# Patient Record
Sex: Female | Born: 1935 | Race: White | Hispanic: No | Marital: Married | Smoking: Never smoker
Health system: Southern US, Community
[De-identification: ages and names within clinical notes are randomized; demographics above are authoritative.]

## PROBLEM LIST (undated history)

## (undated) DIAGNOSIS — E119 Type 2 diabetes mellitus without complications: Secondary | ICD-10-CM

## (undated) DIAGNOSIS — I10 Essential (primary) hypertension: Secondary | ICD-10-CM

## (undated) DIAGNOSIS — I639 Cerebral infarction, unspecified: Secondary | ICD-10-CM

## (undated) HISTORY — PX: APPENDECTOMY: SHX54

---

## 2010-05-09 ENCOUNTER — Other Ambulatory Visit (HOSPITAL_BASED_OUTPATIENT_CLINIC_OR_DEPARTMENT_OTHER): Payer: Self-pay | Admitting: Internal Medicine

## 2010-05-09 DIAGNOSIS — Z139 Encounter for screening, unspecified: Secondary | ICD-10-CM

## 2010-05-10 ENCOUNTER — Ambulatory Visit (HOSPITAL_BASED_OUTPATIENT_CLINIC_OR_DEPARTMENT_OTHER)
Admission: RE | Admit: 2010-05-10 | Discharge: 2010-05-10 | Disposition: A | Discharge status: Home / Self Care | Payer: Medicare Other | Source: Ambulatory Visit | Attending: Internal Medicine | Admitting: Internal Medicine

## 2010-05-10 DIAGNOSIS — Z1231 Encounter for screening mammogram for malignant neoplasm of breast: Secondary | ICD-10-CM | POA: Insufficient documentation

## 2010-05-10 DIAGNOSIS — Z139 Encounter for screening, unspecified: Secondary | ICD-10-CM

## 2011-04-04 ENCOUNTER — Other Ambulatory Visit (HOSPITAL_BASED_OUTPATIENT_CLINIC_OR_DEPARTMENT_OTHER): Payer: Self-pay | Admitting: *Deleted

## 2011-04-04 ENCOUNTER — Other Ambulatory Visit (HOSPITAL_BASED_OUTPATIENT_CLINIC_OR_DEPARTMENT_OTHER): Payer: Self-pay | Admitting: Internal Medicine

## 2011-04-04 DIAGNOSIS — Z1231 Encounter for screening mammogram for malignant neoplasm of breast: Secondary | ICD-10-CM

## 2011-05-16 ENCOUNTER — Other Ambulatory Visit (HOSPITAL_BASED_OUTPATIENT_CLINIC_OR_DEPARTMENT_OTHER): Payer: Self-pay | Admitting: Internal Medicine

## 2011-05-16 ENCOUNTER — Ambulatory Visit (HOSPITAL_BASED_OUTPATIENT_CLINIC_OR_DEPARTMENT_OTHER)
Admission: RE | Admit: 2011-05-16 | Discharge: 2011-05-16 | Disposition: A | Discharge status: Home / Self Care | Payer: Medicare Other | Source: Ambulatory Visit | Attending: Internal Medicine | Admitting: Internal Medicine

## 2011-05-16 DIAGNOSIS — Z1231 Encounter for screening mammogram for malignant neoplasm of breast: Secondary | ICD-10-CM

## 2012-04-08 ENCOUNTER — Other Ambulatory Visit (HOSPITAL_BASED_OUTPATIENT_CLINIC_OR_DEPARTMENT_OTHER): Payer: Self-pay | Admitting: Family Medicine

## 2012-04-08 DIAGNOSIS — Z1231 Encounter for screening mammogram for malignant neoplasm of breast: Secondary | ICD-10-CM

## 2012-05-27 ENCOUNTER — Inpatient Hospital Stay (HOSPITAL_BASED_OUTPATIENT_CLINIC_OR_DEPARTMENT_OTHER): Admission: RE | Admit: 2012-05-27 | Payer: Medicare Other | Source: Ambulatory Visit

## 2012-05-28 ENCOUNTER — Ambulatory Visit (HOSPITAL_BASED_OUTPATIENT_CLINIC_OR_DEPARTMENT_OTHER): Payer: Medicare Other

## 2013-12-21 ENCOUNTER — Other Ambulatory Visit (HOSPITAL_BASED_OUTPATIENT_CLINIC_OR_DEPARTMENT_OTHER): Payer: Self-pay | Admitting: Family Medicine

## 2013-12-21 DIAGNOSIS — Z1231 Encounter for screening mammogram for malignant neoplasm of breast: Secondary | ICD-10-CM

## 2013-12-22 ENCOUNTER — Ambulatory Visit (HOSPITAL_BASED_OUTPATIENT_CLINIC_OR_DEPARTMENT_OTHER)
Admission: RE | Admit: 2013-12-22 | Discharge: 2013-12-22 | Disposition: A | Discharge status: Home / Self Care | Payer: Medicare Other | Source: Ambulatory Visit | Attending: Family Medicine | Admitting: Family Medicine

## 2013-12-22 DIAGNOSIS — Z1231 Encounter for screening mammogram for malignant neoplasm of breast: Secondary | ICD-10-CM | POA: Insufficient documentation

## 2015-01-06 ENCOUNTER — Other Ambulatory Visit (HOSPITAL_BASED_OUTPATIENT_CLINIC_OR_DEPARTMENT_OTHER): Payer: Self-pay | Admitting: Family Medicine

## 2015-01-06 DIAGNOSIS — Z1231 Encounter for screening mammogram for malignant neoplasm of breast: Secondary | ICD-10-CM

## 2015-02-03 ENCOUNTER — Ambulatory Visit (HOSPITAL_BASED_OUTPATIENT_CLINIC_OR_DEPARTMENT_OTHER)
Admission: RE | Admit: 2015-02-03 | Discharge: 2015-02-03 | Disposition: A | Discharge status: Home / Self Care | Payer: Medicare Other | Source: Ambulatory Visit | Attending: Family Medicine | Admitting: Family Medicine

## 2015-02-03 DIAGNOSIS — Z1231 Encounter for screening mammogram for malignant neoplasm of breast: Secondary | ICD-10-CM | POA: Insufficient documentation

## 2016-05-06 ENCOUNTER — Encounter (HOSPITAL_COMMUNITY): Payer: Self-pay | Admitting: Emergency Medicine

## 2016-05-06 ENCOUNTER — Emergency Department (HOSPITAL_COMMUNITY)
Admission: EM | Admit: 2016-05-06 | Discharge: 2016-05-07 | Disposition: A | Discharge status: Home / Self Care | Payer: Medicare Other | Attending: Emergency Medicine | Admitting: Emergency Medicine

## 2016-05-06 ENCOUNTER — Emergency Department (HOSPITAL_COMMUNITY): Payer: Medicare Other

## 2016-05-06 DIAGNOSIS — Y93E2 Activity, laundry: Secondary | ICD-10-CM | POA: Insufficient documentation

## 2016-05-06 DIAGNOSIS — W2203XA Walked into furniture, initial encounter: Secondary | ICD-10-CM | POA: Diagnosis not present

## 2016-05-06 DIAGNOSIS — E119 Type 2 diabetes mellitus without complications: Secondary | ICD-10-CM | POA: Insufficient documentation

## 2016-05-06 DIAGNOSIS — Y9289 Other specified places as the place of occurrence of the external cause: Secondary | ICD-10-CM | POA: Diagnosis not present

## 2016-05-06 DIAGNOSIS — S0093XA Contusion of unspecified part of head, initial encounter: Secondary | ICD-10-CM | POA: Diagnosis not present

## 2016-05-06 DIAGNOSIS — S0990XA Unspecified injury of head, initial encounter: Secondary | ICD-10-CM

## 2016-05-06 DIAGNOSIS — I10 Essential (primary) hypertension: Secondary | ICD-10-CM | POA: Insufficient documentation

## 2016-05-06 DIAGNOSIS — Y999 Unspecified external cause status: Secondary | ICD-10-CM | POA: Diagnosis not present

## 2016-05-06 DIAGNOSIS — Z79899 Other long term (current) drug therapy: Secondary | ICD-10-CM | POA: Insufficient documentation

## 2016-05-06 DIAGNOSIS — Z8673 Personal history of transient ischemic attack (TIA), and cerebral infarction without residual deficits: Secondary | ICD-10-CM | POA: Insufficient documentation

## 2016-05-06 HISTORY — DX: Type 2 diabetes mellitus without complications: E11.9

## 2016-05-06 HISTORY — DX: Cerebral infarction, unspecified: I63.9

## 2016-05-06 HISTORY — DX: Essential (primary) hypertension: I10

## 2016-05-06 LAB — CBG MONITORING, ED: GLUCOSE-CAPILLARY: 106 mg/dL — AB (ref 65–99)

## 2016-05-06 NOTE — ED Triage Notes (Signed)
Pt presents from [facility]. Pt had an unwitnessed fall and struck the back of her head on a washing machine. Denies LOC. Does have a hematoma on the posterior portion of her head. Is anticoagulated on Coumadin.

## 2016-05-06 NOTE — ED Provider Notes (Signed)
Emergency Department Provider Note   I have reviewed the triage vital signs and the nursing notes.   HISTORY  Chief Complaint Fall   HPI Kimberly Mullins is a 83 y.o. female with past medical history of DM, HTN, and CVA on coumadin presents to the emergency department for evaluation after mechanical fall. The patient states that she was doing her laundry and went to move her laundry basket when she suddenly fell backwards striking the back of her head on the ground. No loss of consciousness. She noticed swelling to the back of her head and was transported to the emergency department. No bleeding. No vomiting since incident. Patient denies any neck pain or pain in her extremities. No abdominal pain.   Past Medical History:  Diagnosis Date  . Diabetes mellitus without complication (HCC)   . Hypertension   . Stroke [facility](HCC)     There are no active problems to display for this patient.   Past Surgical History:  Procedure Laterality Date  . APPENDECTOMY        Allergies Erythromycin  No family history on file.  Social History Social History  Substance Use Topics  . Smoking status: Not on file  . Smokeless tobacco: Not on file  . Alcohol use Not on file    Review of Systems  10-point ROS otherwise negative.  ____________________________________________   PHYSICAL EXAM:  VITAL SIGNS: ED Triage Vitals  Enc Vitals Group     BP 05/06/16 2206 184/80     Pulse --      Resp 05/06/16 2206 14     Temp 05/06/16 2206 97.8 F (36.6 C)     Temp Source 05/06/16 2206 Oral     SpO2 05/06/16 2148 94 %     Weight 05/06/16 2207 155 lb (70.3 kg)     Height 05/06/16 2207 5\' 2"  (1.575 m)     Pain Score 05/06/16 2151 1   Constitutional: Alert and oriented. Well appearing and in no acute distress. Eyes: Conjunctivae are normal. PERRL. EOMI. Head:  4 x 4 cm occipital hematoma with no overlying abrasion/laceration.  Nose: No congestion/rhinnorhea. Mouth/Throat: Mucous  membranes are moist.  Oropharynx non-erythematous. Neck: No stridor.  No cervical spine tenderness to palpation. Cardiovascular: Normal rate, regular rhythm. Good peripheral circulation. Grossly normal heart sounds.   Respiratory: Normal respiratory effort.  No retractions. Lungs CTAB. Gastrointestinal: Soft and nontender. No distention.  Musculoskeletal: No upper/lower extremity tenderness nor edema. No gross deformities of extremities. Neurologic:  Normal speech and language. No gross focal neurologic deficits are appreciated.  Skin:  Skin is warm, dry and intact. No rash noted. Psychiatric: Mood and affect are normal. Speech and behavior are normal.  ____________________________________________   LABS (all labs ordered are listed, but only abnormal results are displayed)  Labs Reviewed  CBG MONITORING, ED - Abnormal; Notable for the following:       Result Value   Glucose-Capillary 106 (*)    All other components within normal limits   ____________________________________________  RADIOLOGY  CT Head:   FINDINGS: Brain: No acute territorial infarction, intracranial hemorrhage or focal mass lesion is visualized. There is moderate atrophy. Evidence of old right ganglial capsular infarct with extension into the right periventricular white matter and sub insula. There is ex vacuo dilatation of the right lateral ventricle. Mild periventricular white matter small vessel changes. No midline shift.  Vascular: No hyperdense vessels. Scattered carotid artery calcifications.  Skull: Mastoid air cells are clear. No skull fracture is  seen.  Sinuses/Orbits: No acute orbital abnormality. Paranasal sinuses grossly clear.  Other: Large right posterior scalp hematoma.  IMPRESSION: 1. No definite acute intracranial abnormality 2. Old basal ganglial infarct on the right with ex vacuo dilatation of the right ventricle. 3. Large posterior scalp hematoma. No underlying skull  fracture. 4. Mild periventricular white matter small vessel changes   Electronically Signed By: Jasmine Pang M.D. On: 05/06/2016 23:10 ____________________________________________   PROCEDURES  Procedure(s) performed:   Procedures  None ____________________________________________   INITIAL IMPRESSION / ASSESSMENT AND PLAN / ED COURSE  Pertinent labs & imaging results that were available during my care of the patient were reviewed by me and considered in my medical decision making (see chart for details).  Patient presents to the ED after mechanical fall while doing laundry. Hematoma noted with no laceration or bleeding. Patient is awake and alert with no neurological deficits. No vomiting. Patient is anticoagulated after prior CVA. CT head is negative for acute fracture or bleed. Discussed with patient and family at bedside. Will follow with PCP PRN.   At this time, I do not feel there is any life-threatening condition present. I have reviewed and discussed all results (EKG, imaging, lab, urine as appropriate), exam findings with patient. I have reviewed nursing notes and appropriate previous records.  I feel the patient is safe to be discharged home without further emergent workup. Discussed usual and customary return precautions. Patient and family (if present) verbalize understanding and are comfortable with this plan.  Patient will follow-up with their primary care provider. If they do not have a primary care provider, information for follow-up has been provided to them. All questions have been answered.  ____________________________________________  FINAL CLINICAL IMPRESSION(S) / ED DIAGNOSES  Final diagnoses:  Injury of head, initial encounter     MEDICATIONS GIVEN DURING THIS VISIT:  None  NEW OUTPATIENT MEDICATIONS STARTED DURING THIS VISIT:  None   Note:  This document was prepared using Dragon voice recognition software and may include unintentional  dictation errors.  Alona Bene, MD Emergency Medicine   Maia Plan, MD 05/08/16 [phone]

## 2016-05-06 NOTE — Discharge Instructions (Signed)
You were seen in the Emergency Department (ED) today for a head injury.  Based on your evaluation, you may have sustained a concussion (or bruise) to your brain.  If you had a CT scan done, it did not show any evidence of serious injury or bleeding.   ° °Symptoms to expect from a concussion include nausea, mild to moderate headache, difficulty concentrating or sleeping, and mild lightheadedness.  These symptoms should improve over the next few days to weeks, but it may take many weeks before you feel back to normal.  Return to the emergency department or follow-up with your primary care doctor if your symptoms are not improving over this time. ° °Signs of a more serious head injury include vomiting, severe headache, excessive sleepiness or confusion, and weakness or numbness in your face, arms or legs.  Return immediately to the Emergency Department if you experience any of these more concerning symptoms.   ° °Rest, avoid strenuous physical or mental activity, and avoid activities that could potentially result in another head injury until all your symptoms from this head injury are completely resolved for at least 2-3 weeks.  You may take acetaminophen over the counter according to label instructions for mild headache or scalp soreness. ° °

## 2016-06-04 ENCOUNTER — Other Ambulatory Visit (HOSPITAL_BASED_OUTPATIENT_CLINIC_OR_DEPARTMENT_OTHER): Payer: Self-pay | Admitting: Family Medicine

## 2016-06-04 DIAGNOSIS — Z1231 Encounter for screening mammogram for malignant neoplasm of breast: Secondary | ICD-10-CM

## 2016-06-18 ENCOUNTER — Ambulatory Visit (HOSPITAL_BASED_OUTPATIENT_CLINIC_OR_DEPARTMENT_OTHER): Payer: 59

## 2016-06-25 ENCOUNTER — Ambulatory Visit (HOSPITAL_BASED_OUTPATIENT_CLINIC_OR_DEPARTMENT_OTHER)
Admission: RE | Admit: 2016-06-25 | Discharge: 2016-06-25 | Disposition: A | Discharge status: Home / Self Care | Payer: Medicare Other | Source: Ambulatory Visit | Attending: Family Medicine | Admitting: Family Medicine

## 2016-06-25 DIAGNOSIS — Z1231 Encounter for screening mammogram for malignant neoplasm of breast: Secondary | ICD-10-CM | POA: Insufficient documentation

## 2017-12-02 ENCOUNTER — Other Ambulatory Visit (HOSPITAL_BASED_OUTPATIENT_CLINIC_OR_DEPARTMENT_OTHER): Payer: Self-pay | Admitting: Family Medicine

## 2017-12-02 DIAGNOSIS — Z1231 Encounter for screening mammogram for malignant neoplasm of breast: Secondary | ICD-10-CM

## 2017-12-04 ENCOUNTER — Ambulatory Visit (HOSPITAL_BASED_OUTPATIENT_CLINIC_OR_DEPARTMENT_OTHER)
Admission: RE | Admit: 2017-12-04 | Discharge: 2017-12-04 | Disposition: A | Discharge status: Home / Self Care | Payer: Medicare Other | Source: Ambulatory Visit | Attending: Family Medicine | Admitting: Family Medicine

## 2017-12-04 DIAGNOSIS — Z1231 Encounter for screening mammogram for malignant neoplasm of breast: Secondary | ICD-10-CM | POA: Insufficient documentation

## 2018-10-25 ENCOUNTER — Other Ambulatory Visit: Payer: Self-pay

## 2018-10-25 ENCOUNTER — Emergency Department (HOSPITAL_COMMUNITY)
Admission: EM | Admit: 2018-10-25 | Discharge: 2018-10-25 | Disposition: A | Discharge status: Home / Self Care | Payer: Medicare Other | Attending: Emergency Medicine | Admitting: Emergency Medicine

## 2018-10-25 ENCOUNTER — Emergency Department (HOSPITAL_COMMUNITY): Payer: Medicare Other

## 2018-10-25 DIAGNOSIS — Z7984 Long term (current) use of oral hypoglycemic drugs: Secondary | ICD-10-CM | POA: Diagnosis not present

## 2018-10-25 DIAGNOSIS — Z8673 Personal history of transient ischemic attack (TIA), and cerebral infarction without residual deficits: Secondary | ICD-10-CM | POA: Diagnosis not present

## 2018-10-25 DIAGNOSIS — E119 Type 2 diabetes mellitus without complications: Secondary | ICD-10-CM | POA: Diagnosis not present

## 2018-10-25 DIAGNOSIS — W01198A Fall on same level from slipping, tripping and stumbling with subsequent striking against other object, initial encounter: Secondary | ICD-10-CM | POA: Insufficient documentation

## 2018-10-25 DIAGNOSIS — Y92030 Kitchen in apartment as the place of occurrence of the external cause: Secondary | ICD-10-CM | POA: Insufficient documentation

## 2018-10-25 DIAGNOSIS — S0003XA Contusion of scalp, initial encounter: Secondary | ICD-10-CM | POA: Insufficient documentation

## 2018-10-25 DIAGNOSIS — I10 Essential (primary) hypertension: Secondary | ICD-10-CM | POA: Insufficient documentation

## 2018-10-25 DIAGNOSIS — M545 Low back pain: Secondary | ICD-10-CM | POA: Diagnosis not present

## 2018-10-25 DIAGNOSIS — Z7901 Long term (current) use of anticoagulants: Secondary | ICD-10-CM | POA: Diagnosis not present

## 2018-10-25 DIAGNOSIS — R51 Headache: Secondary | ICD-10-CM | POA: Insufficient documentation

## 2018-10-25 DIAGNOSIS — Y93E9 Activity, other interior property and clothing maintenance: Secondary | ICD-10-CM | POA: Insufficient documentation

## 2018-10-25 DIAGNOSIS — W19XXXA Unspecified fall, initial encounter: Secondary | ICD-10-CM

## 2018-10-25 DIAGNOSIS — Y999 Unspecified external cause status: Secondary | ICD-10-CM | POA: Diagnosis not present

## 2018-10-25 LAB — CBC
HCT: 40.4 % (ref 36.0–46.0)
Hemoglobin: 13.1 g/dL (ref 12.0–15.0)
MCH: 29.4 pg (ref 26.0–34.0)
MCHC: 32.4 g/dL (ref 30.0–36.0)
MCV: 90.8 fL (ref 80.0–100.0)
Platelets: 224 10*3/uL (ref 150–400)
RBC: 4.45 MIL/uL (ref 3.87–5.11)
RDW: 13.8 % (ref 11.5–15.5)
WBC: 9.9 10*3/uL (ref 4.0–10.5)
nRBC: 0 % (ref 0.0–0.2)

## 2018-10-25 LAB — URINALYSIS, ROUTINE W REFLEX MICROSCOPIC
Bilirubin Urine: NEGATIVE
Glucose, UA: NEGATIVE mg/dL
Hgb urine dipstick: NEGATIVE
Ketones, ur: NEGATIVE mg/dL
Leukocytes,Ua: NEGATIVE
Nitrite: NEGATIVE
Protein, ur: >=300 mg/dL — AB
Specific Gravity, Urine: 1.013 (ref 1.005–1.030)
pH: 6 (ref 5.0–8.0)

## 2018-10-25 LAB — BASIC METABOLIC PANEL
Anion gap: 13 (ref 5–15)
BUN: 22 mg/dL (ref 8–23)
CO2: 20 mmol/L — ABNORMAL LOW (ref 22–32)
Calcium: 9.6 mg/dL (ref 8.9–10.3)
Chloride: 106 mmol/L (ref 98–111)
Creatinine, Ser: 0.86 mg/dL (ref 0.44–1.00)
GFR calc Af Amer: >60 mL/min (ref >60)
GFR calc non Af Amer: >60 mL/min (ref >60)
Glucose, Bld: 105 mg/dL — ABNORMAL HIGH (ref 70–99)
Potassium: 4.5 mmol/L (ref 3.5–5.1)
Sodium: 139 mmol/L (ref 135–145)

## 2018-10-25 MED ORDER — ACETAMINOPHEN 325 MG PO TABS
650.0000 mg | ORAL_TABLET | Freq: Once | ORAL | Status: AC
  Administered 2018-10-25: 23:00:00 650 mg via ORAL
  Filled 2018-10-25: qty 2

## 2018-10-25 NOTE — ED Provider Notes (Signed)
[facility] EMERGENCY DEPARTMENT Provider Note   CSN: [id number] Arrival date & time: 10/25/18  1939     History   Chief Complaint Chief Complaint  Patient presents with  . Back Pain    HPI Kimberly Mullins is a 85 y.o. female.     HPI  85 year old female presents today after fall.  She states that she was attempting to remove ice from her freezer.  It came loose suddenly and she was pulled backwards which caused her to fall backwards and landed in a sitting position and rolled back striking her head.  She complains of some mild headache and pain in the low back area.  She denies loss of consciousness.  She is on Coumadin.  She has a prior history of stroke.  Reports no ongoing residual symptoms from this.  She denies fever, chills, chest pain, dyspnea, nausea, vomiting, or diarrhea.  Past Medical History:  Diagnosis Date  . Diabetes mellitus without complication ([place])   . Hypertension   . Stroke [facility])     There are no active problems to display for this patient.   Past Surgical History:  Procedure Laterality Date  . APPENDECTOMY       OB History   No obstetric history on file.      Home Medications    Prior to Admission medications   Medication Sig Start Date End Date Taking? Authorizing Provider  acetaminophen (TYLENOL) 325 MG tablet Take 650 mg by mouth every 6 (six) hours as needed for mild pain, fever or headache.   Yes [provider]  calcium carbonate (TUMS - DOSED IN MG ELEMENTAL CALCIUM) 500 MG chewable tablet Chew 1 tablet by mouth 3 (three) times daily as needed for indigestion or heartburn.   Yes [provider]  cetirizine (ZYRTEC) 10 MG tablet Take 10 mg by mouth daily.   Yes [provider]  cholecalciferol (VITAMIN D3) 25 MCG (1000 UT) tablet Take 1,000 Units by mouth daily.   Yes [provider]  fenofibrate micronized (LOFIBRA) 134 MG capsule Take 134 mg by mouth daily before breakfast.   Yes  [provider]  lisinopril (ZESTRIL) 20 MG tablet Take 20 mg by mouth daily.   Yes [provider]  loperamide (LOPERAMIDE A-D) 2 MG tablet Take 2 mg by mouth 4 (four) times daily as needed for diarrhea or loose stools.   Yes [provider]  metFORMIN (GLUCOPHAGE) 500 MG tablet Take 500 mg by mouth 2 (two) times daily with a meal.   Yes [provider]  metoprolol succinate (TOPROL-XL) 50 MG 24 hr tablet Take 50 mg by mouth daily. Take with or immediately following a meal.   Yes [provider]  Omega-3 1000 MG CAPS Take 1 capsule by mouth daily.   Yes [provider]  oxybutynin (DITROPAN) 5 MG tablet Take 5 mg by mouth 2 (two) times daily.   Yes [provider]  Simethicone 80 MG TABS Take 2-4 tablets by mouth daily as needed (gas).   Yes [provider]  simvastatin (ZOCOR) 40 MG tablet Take 40 mg by mouth daily at 6 PM.   Yes [provider]  vitamin C (ASCORBIC ACID) 250 MG tablet Take 250 mg by mouth 2 (two) times daily.   Yes [provider]  warfarin (COUMADIN) 5 MG tablet Take 2.5-5 mg by mouth See admin instructions. 2.5mg  on TUES and THUR, 5mg  on all other days.   Yes [provider]    Family History No family history on file.  Social History Social History   Tobacco Use  . Smoking status: Not on file  Substance Use Topics  . Alcohol use: Not on file  . Drug use: Not on file     Allergies   Erythromycin   Review of Systems Review of Systems  All other systems reviewed and are negative.    Physical Exam Updated Vital Signs BP (!) 177/79   Pulse (!) 103   Temp 98.6 F (37 C) (Rectal)   Resp 16   Ht 1.588 m (5' 2.5")   Wt 69.9 kg   SpO2 96%   BMI 27.72 kg/m   Physical Exam Vitals signs and nursing note reviewed.  Constitutional:      General: She is not in acute distress.    Appearance: Normal appearance. She is normal weight. She is not ill-appearing.   HENT:     Head: Normocephalic and atraumatic.     Right Ear: External ear normal.     Left Ear: External ear normal.     Nose: Nose normal.     Mouth/Throat:     Mouth: Mucous membranes are moist.  Eyes:     Extraocular Movements: Extraocular movements intact.     Pupils: Pupils are equal, round, and reactive to light.  Neck:     Musculoskeletal: Normal range of motion and neck supple.  Cardiovascular:     Rate and Rhythm: Normal rate.     Pulses: Normal pulses.     Heart sounds: Normal heart sounds.  Pulmonary:     Effort: Pulmonary effort is normal.     Breath sounds: Normal breath sounds.  Abdominal:     General: Abdomen is flat.     Palpations: Abdomen is soft.  Musculoskeletal: Normal range of motion.     Comments: Mild diffuse tenderness palpation low back area No tenderness palpation over thoracic or L-spine No obvious external signs of trauma  Skin:    General: Skin is warm and dry.     Capillary Refill: Capillary refill takes less than 2 seconds.  Neurological:     General: No focal deficit present.     Mental Status: She is alert and oriented to person, place, and time.     Cranial Nerves: No cranial nerve deficit.     Motor: No weakness.     Deep Tendon Reflexes: Reflexes normal.  Psychiatric:        Mood and Affect: Mood normal.        Behavior: Behavior normal.      ED Treatments / Results  Labs (all labs ordered are listed, but only abnormal results are displayed) Labs Reviewed  URINALYSIS, ROUTINE W REFLEX MICROSCOPIC - Abnormal; Notable for the following components:      Result Value   Color, Urine STRAW (*)    Protein, ur >=300 (*)    Bacteria, UA RARE (*)    All other components within normal limits  BASIC METABOLIC PANEL - Abnormal; Notable for the following components:   CO2 20 (*)    Glucose, Bld 105 (*)    All other components within normal limits  CBC    EKG None  Radiology Ct Head Wo Contrast  Result Date: 10/25/2018 CLINICAL  DATA:  Status post fall EXAM: CT HEAD WITHOUT CONTRAST CT CERVICAL SPINE WITHOUT CONTRAST TECHNIQUE: Multidetector CT imaging of the head and cervical spine was performed following the standard protocol without intravenous contrast. Multiplanar  CT image reconstructions of the cervical spine were also generated. COMPARISON:  May 07, 2016 FINDINGS: CT HEAD FINDINGS Brain: No evidence of acute infarction, hemorrhage, hydrocephalus, extra-axial collection or mass lesion/mass effect. Moderate brain parenchymal volume loss and deep white matter microangiopathy. Encephalomalacia from prior right periventricular subcortical frontal infarct. Vascular: No hyperdense vessel or unexpected calcification. Skull: Normal. Negative for fracture or focal lesion. Sinuses/Orbits: No acute finding. Other: None. CT CERVICAL SPINE FINDINGS Alignment: Straightening of the cervical lordosis. Minimal anterolisthesis of C4 on C5. Skull base and vertebrae: No acute fracture. No primary bone lesion or focal pathologic process. Soft tissues and spinal canal: No prevertebral fluid or swelling. No visible canal hematoma. Disc levels: Multilevel osteoarthritic changes with disc space narrowing, remodeling of vertebral bodies posterior facet arthropathy. Upper chest: Negative. Other: IMPRESSION: 1. No acute intracranial abnormality. 2. Atrophy, chronic microvascular disease. Old right subcortical/periventricular frontal infarct. 3. No evidence of acute traumatic injury to the cervical spine. 4. Multilevel osteoarthritic changes of the cervical spine with minimal anterolisthesis of C4 on C5. Electronically Signed   By: Ted Mcalpineobrinka  Dimitrova M.D.   On: 10/25/2018 21:14   Ct Cervical Spine Wo Contrast  Result Date: 10/25/2018 CLINICAL DATA:  Status post fall EXAM: CT HEAD WITHOUT CONTRAST CT CERVICAL SPINE WITHOUT CONTRAST TECHNIQUE: Multidetector CT imaging of the head and cervical spine was performed following the standard protocol without  intravenous contrast. Multiplanar CT image reconstructions of the cervical spine were also generated. COMPARISON:  May 07, 2016 FINDINGS: CT HEAD FINDINGS Brain: No evidence of acute infarction, hemorrhage, hydrocephalus, extra-axial collection or mass lesion/mass effect. Moderate brain parenchymal volume loss and deep white matter microangiopathy. Encephalomalacia from prior right periventricular subcortical frontal infarct. Vascular: No hyperdense vessel or unexpected calcification. Skull: Normal. Negative for fracture or focal lesion. Sinuses/Orbits: No acute finding. Other: None. CT CERVICAL SPINE FINDINGS Alignment: Straightening of the cervical lordosis. Minimal anterolisthesis of C4 on C5. Skull base and vertebrae: No acute fracture. No primary bone lesion or focal pathologic process. Soft tissues and spinal canal: No prevertebral fluid or swelling. No visible canal hematoma. Disc levels: Multilevel osteoarthritic changes with disc space narrowing, remodeling of vertebral bodies posterior facet arthropathy. Upper chest: Negative. Other: IMPRESSION: 1. No acute intracranial abnormality. 2. Atrophy, chronic microvascular disease. Old right subcortical/periventricular frontal infarct. 3. No evidence of acute traumatic injury to the cervical spine. 4. Multilevel osteoarthritic changes of the cervical spine with minimal anterolisthesis of C4 on C5. Electronically Signed   By: Ted Mcalpineobrinka  Dimitrova M.D.   On: 10/25/2018 21:14   Ct Lumbar Spine Wo Contrast  Result Date: 10/25/2018 CLINICAL DATA:  Initial evaluation for acute trauma, fall. EXAM: CT LUMBAR SPINE WITHOUT CONTRAST TECHNIQUE: Multidetector CT imaging of the lumbar spine was performed without intravenous contrast administration. Multiplanar CT image reconstructions were also generated. COMPARISON:  None available. FINDINGS: Segmentation: Standard. Lowest well-formed disc labeled the L5-S1 level. Alignment: Levoscoliosis with apex at L1-2. Up to 5 mm  retrolisthesis of L1 on L2, with 3 mm anterolisthesis of L4 on L5. Trace anterolisthesis of L5 on S1. Findings chronic and facet mediated. Vertebrae: Vertebral body height maintained without evidence for acute or chronic fracture. Visualized sacrum and pelvis intact. SI joints approximated symmetric. No discrete lytic or blastic osseous lesions. Paraspinal and other soft tissues: Paraspinous soft tissues demonstrate no acute finding. Vascular calcifications noted within the intra-abdominal aorta. Cholelithiasis noted. Extensive colonic diverticulosis without visible evidence for acute diverticulitis. Exophytic calcified uterine fibroid partially visualized. Disc levels: L1-2: Retrolisthesis. Chronic intervertebral  disc space narrowing with diffuse disc bulge and disc desiccation. Reactive endplate changes with marginal endplate osteophytic spurring. Superimposed mild to moderate bilateral facet hypertrophy. Resultant moderate to severe spinal stenosis with bilateral lateral recess narrowing. Moderate to advanced bilateral L1 foraminal stenosis, worse on the right. L2-3: Mild diffuse disc bulge with annular calcification. Facet and ligament flavum hypertrophy. Resultant mild spinal stenosis. Foramina remain patent. L3-4: Diffuse disc bulge. Moderate to advanced facet and ligament flavum hypertrophy. Resultant severe spinal stenosis. Mild to moderate bilateral foraminal narrowing, left slightly worse than right. L4-5: Trace anterolisthesis. Diffuse disc bulge with annular calcification. Superimposed broad-based left foraminal/extraforaminal disc protrusion contacts the exiting left L4 nerve root as it courses of the left neural foramen. Superimposed moderate bilateral facet degeneration. Resultant moderate to severe canal with bilateral lateral recess stenosis. Moderate left greater than right L4 foraminal narrowing. L5-S1: Trace anterolisthesis. Mild annular disc bulge. Severe right with moderate left facet  hypertrophy. Resultant mild right greater than left lateral recess stenosis. Foramina remain patent. IMPRESSION: 1. No CT evidence for acute traumatic injury within the lumbar spine. 2. Levoscoliosis with multilevel degenerative spondylolysis as above, most pronounced at L1-2, L3-4, and L4-5 where there is resultant moderate to severe spinal stenosis. Multilevel moderate to advanced foraminal narrowing as above. 3. Cholelithiasis. 4. Colonic diverticulosis. 5. Calcified uterine fibroid, partially visualized. Electronically Signed   By: Rise MuBenjamin  McClintock M.D.   On: 10/25/2018 21:29   Dg Hips Bilat W Or Wo Pelvis 2 Views  Result Date: 10/25/2018 CLINICAL DATA:  Pain after fall EXAM: DG HIP (WITH OR WITHOUT PELVIS) 2V BILAT COMPARISON:  None. FINDINGS: Evaluation of the left hip is mildly limited due to positioning. Within these limitations, there are no convincing fractures in either hip or the pelvic bones. IMPRESSION: Mildly limited study due to positioning. Within these limitations, no evidence of fractures. Electronically Signed   By: Gerome Samavid  Williams III M.D   On: 10/25/2018 21:04    Procedures Procedures (including critical care time)  Medications Ordered in ED Medications - No data to display   Initial Impression / Assessment and Plan / ED Course  I have reviewed the triage vital signs and the nursing notes.  Pertinent labs & imaging results that were available during my care of the patient were reviewed by me and considered in my medical decision making (see chart for details).        85 year old female presents today after mechanical fall. CT obtained head, neck, and lumbar spine without any evidence of acute fracture or intracerebral hemorrhage Pelvis with bilateral hip x-Ashling Roane obtained without evidence of acute fracture Patient appears stable for discharge. Final Clinical Impressions(s) / ED Diagnoses   Final diagnoses:  Fall, initial encounter  Long term current use of  anticoagulant  Contusion of scalp, initial encounter    ED Discharge Orders    None       Margarita Grizzleay, Rodert Hinch, MD 10/25/18 2247

## 2018-10-25 NOTE — Discharge Instructions (Addendum)
X-rays obtained of your head, neck,, hips, pelvis and back revealed no evidence of acute fracture If your headache worsens or you become weak on one side or the other you should return immediately to the hospital.

## 2018-10-25 NOTE — ED Triage Notes (Signed)
Pt arrived via [facility] from [place] after experiencing a ground level fall when she slipped attempting to open the refrigerator. She fell backwards landing on her buttocks and hitting the posterior aspect of her head. No loss of consciousness. At this time, her only complaint is a mild headache and back pain in the lumbar region. She is on warfarin.

## 2018-10-25 NOTE — ED Notes (Signed)
Patient verbalizes understanding of discharge instructions. Opportunity for questioning and answers were provided. Armband removed by staff, pt discharged from ED with PTAR.  

## 2019-02-20 ENCOUNTER — Other Ambulatory Visit: Payer: Self-pay

## 2019-02-20 ENCOUNTER — Inpatient Hospital Stay (HOSPITAL_COMMUNITY)
Admission: EM | Admit: 2019-02-20 | Discharge: 2019-02-27 | DRG: 065 | Disposition: A | Discharge status: Skilled Nursing Facility | Payer: Medicare Other | Attending: Neurology | Admitting: Neurology

## 2019-02-20 DIAGNOSIS — R509 Fever, unspecified: Secondary | ICD-10-CM

## 2019-02-20 DIAGNOSIS — J189 Pneumonia, unspecified organism: Secondary | ICD-10-CM

## 2019-02-20 DIAGNOSIS — R079 Chest pain, unspecified: Secondary | ICD-10-CM

## 2019-02-20 DIAGNOSIS — I050 Rheumatic mitral stenosis: Secondary | ICD-10-CM | POA: Diagnosis present

## 2019-02-20 DIAGNOSIS — I611 Nontraumatic intracerebral hemorrhage in hemisphere, cortical: Principal | ICD-10-CM | POA: Diagnosis present

## 2019-02-20 DIAGNOSIS — I4891 Unspecified atrial fibrillation: Secondary | ICD-10-CM | POA: Diagnosis present

## 2019-02-20 DIAGNOSIS — E119 Type 2 diabetes mellitus without complications: Secondary | ICD-10-CM | POA: Diagnosis present

## 2019-02-20 DIAGNOSIS — Z79899 Other long term (current) drug therapy: Secondary | ICD-10-CM

## 2019-02-20 DIAGNOSIS — I10 Essential (primary) hypertension: Secondary | ICD-10-CM

## 2019-02-20 DIAGNOSIS — I619 Nontraumatic intracerebral hemorrhage, unspecified: Secondary | ICD-10-CM | POA: Diagnosis present

## 2019-02-20 DIAGNOSIS — R531 Weakness: Secondary | ICD-10-CM | POA: Diagnosis present

## 2019-02-20 DIAGNOSIS — I618 Other nontraumatic intracerebral hemorrhage: Secondary | ICD-10-CM

## 2019-02-20 DIAGNOSIS — E785 Hyperlipidemia, unspecified: Secondary | ICD-10-CM | POA: Diagnosis present

## 2019-02-20 DIAGNOSIS — Z20828 Contact with and (suspected) exposure to other viral communicable diseases: Secondary | ICD-10-CM | POA: Diagnosis present

## 2019-02-20 DIAGNOSIS — Z7901 Long term (current) use of anticoagulants: Secondary | ICD-10-CM

## 2019-02-20 DIAGNOSIS — I160 Hypertensive urgency: Secondary | ICD-10-CM

## 2019-02-20 DIAGNOSIS — E0800 Diabetes mellitus due to underlying condition with hyperosmolarity without nonketotic hyperglycemic-hyperosmolar coma (NKHHC): Secondary | ICD-10-CM

## 2019-02-20 DIAGNOSIS — Z7984 Long term (current) use of oral hypoglycemic drugs: Secondary | ICD-10-CM

## 2019-02-20 DIAGNOSIS — I161 Hypertensive emergency: Secondary | ICD-10-CM | POA: Diagnosis present

## 2019-02-20 DIAGNOSIS — Z8249 Family history of ischemic heart disease and other diseases of the circulatory system: Secondary | ICD-10-CM

## 2019-02-20 DIAGNOSIS — R40243 Glasgow coma scale score 3-8, unspecified time: Secondary | ICD-10-CM | POA: Diagnosis present

## 2019-02-20 DIAGNOSIS — E876 Hypokalemia: Secondary | ICD-10-CM | POA: Diagnosis present

## 2019-02-20 DIAGNOSIS — Z8619 Personal history of other infectious and parasitic diseases: Secondary | ICD-10-CM

## 2019-02-20 DIAGNOSIS — D689 Coagulation defect, unspecified: Secondary | ICD-10-CM | POA: Diagnosis present

## 2019-02-20 DIAGNOSIS — D649 Anemia, unspecified: Secondary | ICD-10-CM | POA: Diagnosis present

## 2019-02-20 LAB — BASIC METABOLIC PANEL
Anion gap: 11 (ref 5–15)
BUN: 14 mg/dL (ref 8–23)
CO2: 23 mmol/L (ref 22–32)
Calcium: 9.7 mg/dL (ref 8.9–10.3)
Chloride: 101 mmol/L (ref 98–111)
Creatinine, Ser: 0.7 mg/dL (ref 0.44–1.00)
GFR calc Af Amer: >60 mL/min (ref >60)
GFR calc non Af Amer: >60 mL/min (ref >60)
Glucose, Bld: 103 mg/dL — ABNORMAL HIGH (ref 70–99)
Potassium: 3.7 mmol/L (ref 3.5–5.1)
Sodium: 135 mmol/L (ref 135–145)

## 2019-02-20 LAB — CBG MONITORING, ED: Glucose-Capillary: 95 mg/dL (ref 70–99)

## 2019-02-20 LAB — CBC
HCT: 38.3 % (ref 36.0–46.0)
Hemoglobin: 12.4 g/dL (ref 12.0–15.0)
MCH: 29.3 pg (ref 26.0–34.0)
MCHC: 32.4 g/dL (ref 30.0–36.0)
MCV: 90.5 fL (ref 80.0–100.0)
Platelets: 254 10*3/uL (ref 150–400)
RBC: 4.23 MIL/uL (ref 3.87–5.11)
RDW: 13.3 % (ref 11.5–15.5)
WBC: 8.9 10*3/uL (ref 4.0–10.5)
nRBC: 0 % (ref 0.0–0.2)

## 2019-02-20 NOTE — ED Triage Notes (Signed)
Pt here from [place] for weakness in bilateral legs onset last night. Hx stroke with left sided deficits, per EMS more weak than normal on L side. Denies falling. Pt walking per her usual yesterday during the day.

## 2019-02-21 ENCOUNTER — Emergency Department (HOSPITAL_COMMUNITY): Payer: Medicare Other

## 2019-02-21 ENCOUNTER — Encounter (HOSPITAL_COMMUNITY): Payer: Self-pay | Admitting: *Deleted

## 2019-02-21 ENCOUNTER — Inpatient Hospital Stay (HOSPITAL_COMMUNITY): Payer: Medicare Other

## 2019-02-21 DIAGNOSIS — I610 Nontraumatic intracerebral hemorrhage in hemisphere, subcortical: Secondary | ICD-10-CM

## 2019-02-21 DIAGNOSIS — I050 Rheumatic mitral stenosis: Secondary | ICD-10-CM | POA: Diagnosis present

## 2019-02-21 DIAGNOSIS — I160 Hypertensive urgency: Secondary | ICD-10-CM | POA: Diagnosis not present

## 2019-02-21 DIAGNOSIS — R40243 Glasgow coma scale score 3-8, unspecified time: Secondary | ICD-10-CM | POA: Diagnosis present

## 2019-02-21 DIAGNOSIS — Z20828 Contact with and (suspected) exposure to other viral communicable diseases: Secondary | ICD-10-CM | POA: Diagnosis present

## 2019-02-21 DIAGNOSIS — Z8249 Family history of ischemic heart disease and other diseases of the circulatory system: Secondary | ICD-10-CM | POA: Diagnosis not present

## 2019-02-21 DIAGNOSIS — E119 Type 2 diabetes mellitus without complications: Secondary | ICD-10-CM | POA: Diagnosis present

## 2019-02-21 DIAGNOSIS — D649 Anemia, unspecified: Secondary | ICD-10-CM | POA: Diagnosis present

## 2019-02-21 DIAGNOSIS — I619 Nontraumatic intracerebral hemorrhage, unspecified: Secondary | ICD-10-CM | POA: Diagnosis present

## 2019-02-21 DIAGNOSIS — Z79899 Other long term (current) drug therapy: Secondary | ICD-10-CM | POA: Diagnosis not present

## 2019-02-21 DIAGNOSIS — Z7901 Long term (current) use of anticoagulants: Secondary | ICD-10-CM | POA: Diagnosis not present

## 2019-02-21 DIAGNOSIS — D689 Coagulation defect, unspecified: Secondary | ICD-10-CM | POA: Diagnosis present

## 2019-02-21 DIAGNOSIS — Z7984 Long term (current) use of oral hypoglycemic drugs: Secondary | ICD-10-CM | POA: Diagnosis not present

## 2019-02-21 DIAGNOSIS — Z8619 Personal history of other infectious and parasitic diseases: Secondary | ICD-10-CM | POA: Diagnosis not present

## 2019-02-21 DIAGNOSIS — E876 Hypokalemia: Secondary | ICD-10-CM | POA: Diagnosis present

## 2019-02-21 DIAGNOSIS — I4891 Unspecified atrial fibrillation: Secondary | ICD-10-CM | POA: Diagnosis present

## 2019-02-21 DIAGNOSIS — R079 Chest pain, unspecified: Secondary | ICD-10-CM | POA: Diagnosis not present

## 2019-02-21 DIAGNOSIS — R531 Weakness: Secondary | ICD-10-CM | POA: Diagnosis present

## 2019-02-21 DIAGNOSIS — I6389 Other cerebral infarction: Secondary | ICD-10-CM | POA: Diagnosis not present

## 2019-02-21 DIAGNOSIS — I161 Hypertensive emergency: Secondary | ICD-10-CM | POA: Diagnosis present

## 2019-02-21 DIAGNOSIS — E785 Hyperlipidemia, unspecified: Secondary | ICD-10-CM | POA: Diagnosis present

## 2019-02-21 DIAGNOSIS — I10 Essential (primary) hypertension: Secondary | ICD-10-CM | POA: Diagnosis present

## 2019-02-21 DIAGNOSIS — I611 Nontraumatic intracerebral hemorrhage in hemisphere, cortical: Secondary | ICD-10-CM | POA: Diagnosis present

## 2019-02-21 LAB — GLUCOSE, CAPILLARY
Glucose-Capillary: 98 mg/dL (ref 70–99)
Glucose-Capillary: 121 mg/dL — ABNORMAL HIGH (ref 70–99)
Glucose-Capillary: 188 mg/dL — ABNORMAL HIGH (ref 70–99)
Glucose-Capillary: 121 mg/dL — ABNORMAL HIGH (ref 70–99)

## 2019-02-21 LAB — HEMOGLOBIN A1C
Hgb A1c MFr Bld: 6.1 % — ABNORMAL HIGH (ref 4.8–5.6)
Mean Plasma Glucose: 128.37 mg/dL

## 2019-02-21 LAB — SARS CORONAVIRUS 2 (TAT 6-24 HRS): SARS Coronavirus 2: NEGATIVE

## 2019-02-21 LAB — CBC WITH DIFFERENTIAL/PLATELET
Abs Immature Granulocytes: 0.04 10*3/uL (ref 0.00–0.07)
Basophils Absolute: 0 10*3/uL (ref 0.0–0.1)
Basophils Relative: 0 %
Eosinophils Absolute: 0 10*3/uL (ref 0.0–0.5)
Eosinophils Relative: 0 %
HCT: 37.6 % (ref 36.0–46.0)
Hemoglobin: 12.4 g/dL (ref 12.0–15.0)
Immature Granulocytes: 0 %
Lymphocytes Relative: 24 %
Lymphs Abs: 2.7 10*3/uL (ref 0.7–4.0)
MCH: 29.5 pg (ref 26.0–34.0)
MCHC: 33 g/dL (ref 30.0–36.0)
MCV: 89.5 fL (ref 80.0–100.0)
Monocytes Absolute: 1.2 10*3/uL — ABNORMAL HIGH (ref 0.1–1.0)
Monocytes Relative: 10 %
Neutro Abs: 7.3 10*3/uL (ref 1.7–7.7)
Neutrophils Relative %: 66 %
Platelets: 231 10*3/uL (ref 150–400)
RBC: 4.2 MIL/uL (ref 3.87–5.11)
RDW: 13.2 % (ref 11.5–15.5)
WBC: 11.3 10*3/uL — ABNORMAL HIGH (ref 4.0–10.5)
nRBC: 0 % (ref 0.0–0.2)

## 2019-02-21 LAB — PROTIME-INR
INR: 2 — ABNORMAL HIGH (ref 0.8–1.2)
INR: 1.4 — ABNORMAL HIGH (ref 0.8–1.2)
Prothrombin Time: 23 seconds — ABNORMAL HIGH (ref 11.4–15.2)
Prothrombin Time: 17 seconds — ABNORMAL HIGH (ref 11.4–15.2)

## 2019-02-21 LAB — APTT: aPTT: 34 seconds (ref 24–36)

## 2019-02-21 LAB — ETHANOL: Alcohol, Ethyl (B): <10 mg/dL (ref <10)

## 2019-02-21 LAB — MRSA PCR SCREENING: MRSA by PCR: NEGATIVE

## 2019-02-21 MED ORDER — STROKE: EARLY STAGES OF RECOVERY BOOK
Freq: Once | Status: AC
  Administered 2019-02-21: 05:00:00

## 2019-02-21 MED ORDER — INSULIN ASPART 100 UNIT/ML ~~LOC~~ SOLN: 0.0000 [IU] | Freq: Three times a day (TID) | SUBCUTANEOUS | Status: DC

## 2019-02-21 MED ORDER — NICARDIPINE HCL IN NACL 20-0.86 MG/200ML-% IV SOLN
0.0000 mg/h | INTRAVENOUS | Status: DC
  Administered 2019-02-21: 06:00:00 5 mg/h via INTRAVENOUS
  Administered 2019-02-21: 10:00:00 2.5 mg/h via INTRAVENOUS
  Filled 2019-02-21 (×3): qty 200

## 2019-02-21 MED ORDER — LISINOPRIL 20 MG PO TABS
20.0000 mg | ORAL_TABLET | Freq: Every day | ORAL | Status: DC
  Administered 2019-02-21 – 2019-02-22 (×2): 20 mg via ORAL
  Filled 2019-02-21 (×2): qty 1

## 2019-02-21 MED ORDER — SENNOSIDES-DOCUSATE SODIUM 8.6-50 MG PO TABS
1.0000 | ORAL_TABLET | Freq: Two times a day (BID) | ORAL | Status: DC
  Administered 2019-02-21 – 2019-02-27 (×13): 1 via ORAL
  Filled 2019-02-21 (×13): qty 1

## 2019-02-21 MED ORDER — VITAMIN K1 10 MG/ML IJ SOLN
10.0000 mg | Freq: Once | INTRAVENOUS | Status: AC
  Administered 2019-02-21: 04:00:00 10 mg via INTRAVENOUS
  Filled 2019-02-21: qty 1

## 2019-02-21 MED ORDER — ACETAMINOPHEN 325 MG PO TABS
650.0000 mg | ORAL_TABLET | ORAL | Status: DC | PRN
  Administered 2019-02-21 – 2019-02-27 (×19): 650 mg via ORAL
  Filled 2019-02-21 (×19): qty 2

## 2019-02-21 MED ORDER — INSULIN ASPART 100 UNIT/ML ~~LOC~~ SOLN
0.0000 [IU] | Freq: Three times a day (TID) | SUBCUTANEOUS | Status: DC
  Administered 2019-02-21: 18:00:00 2 [IU] via SUBCUTANEOUS
  Administered 2019-02-22 – 2019-02-26 (×5): 1 [IU] via SUBCUTANEOUS

## 2019-02-21 MED ORDER — PANTOPRAZOLE SODIUM 40 MG IV SOLR: 40.0000 mg | Freq: Every day | INTRAVENOUS | Status: DC

## 2019-02-21 MED ORDER — METOPROLOL SUCCINATE ER 25 MG PO TB24
25.0000 mg | ORAL_TABLET | Freq: Every day | ORAL | Status: DC
  Administered 2019-02-21 – 2019-02-22 (×2): 25 mg via ORAL
  Filled 2019-02-21 (×2): qty 1

## 2019-02-21 MED ORDER — ACETAMINOPHEN 650 MG RE SUPP: 650.0000 mg | RECTAL | Status: DC | PRN

## 2019-02-21 MED ORDER — CHLORHEXIDINE GLUCONATE CLOTH 2 % EX PADS
6.0000 | MEDICATED_PAD | Freq: Every day | CUTANEOUS | Status: DC
  Administered 2019-02-21 – 2019-02-24 (×4): 6 via TOPICAL

## 2019-02-21 MED ORDER — PANTOPRAZOLE SODIUM 40 MG PO TBEC
40.0000 mg | DELAYED_RELEASE_TABLET | Freq: Every day | ORAL | Status: DC
  Administered 2019-02-21 – 2019-02-25 (×5): 40 mg via ORAL
  Filled 2019-02-21 (×7): qty 1

## 2019-02-21 MED ORDER — LABETALOL HCL 5 MG/ML IV SOLN
10.0000 mg | Freq: Once | INTRAVENOUS | Status: AC
  Administered 2019-02-21: 10 mg via INTRAVENOUS
  Filled 2019-02-21: qty 4

## 2019-02-21 MED ORDER — ACETAMINOPHEN 160 MG/5ML PO SOLN: 650.0000 mg | ORAL | Status: DC | PRN

## 2019-02-21 NOTE — ED Provider Notes (Addendum)
[facility] EMERGENCY DEPARTMENT Provider Note   CSN: [id number] Arrival date & time: 02/20/19  1345     History   Chief Complaint Chief Complaint  Patient presents with  . Weakness    HPI Kimberly Mullins is a 85 y.o. female.     HPI   Pt is an 85 y/o female with a h/o DM, HTN, CVA who presents to the ED today for eval of weakness.  Patient states she is been feeling weak since yesterday.  She reports a history of left-sided deficits from prior CVA but today her symptoms seem to worsen the left side and she also has some weakness on the right side.  She denies any sensory changes.  Denies any difficulty with her speech other than some chronic intermittent aphasia.  Denies slurred speech or visual changes.  She is having a mild headache.  She denies any other symptoms including no recent fevers, chest pain, shortness of breath, cough, abdominal pain, nausea, vomiting, diarrhea or urinary symptoms.  Past Medical History:  Diagnosis Date  . Diabetes mellitus without complication ([place])   . Hypertension   . Stroke [facility])     Patient Active Problem List   Diagnosis Date Noted  . ICH (intracerebral hemorrhage) ([place]) 02/21/2019    Past Surgical History:  Procedure Laterality Date  . APPENDECTOMY       OB History   No obstetric history on file.      Home Medications    Prior to Admission medications   Medication Sig Start Date End Date Taking? Authorizing Provider  acetaminophen (TYLENOL) 325 MG tablet Take 650 mg by mouth every 6 (six) hours as needed for mild pain, fever or headache.    [provider]  calcium carbonate (TUMS - DOSED IN MG ELEMENTAL CALCIUM) 500 MG chewable tablet Chew 1 tablet by mouth 3 (three) times daily as needed for indigestion or heartburn.    [provider]  cetirizine (ZYRTEC) 10 MG tablet Take 10 mg by mouth daily.    [provider]  cholecalciferol (VITAMIN D3) 25 MCG (1000 UT) tablet Take 1,000  Units by mouth daily.    [provider]  fenofibrate micronized (LOFIBRA) 134 MG capsule Take 134 mg by mouth daily before breakfast.    [provider]  lisinopril (ZESTRIL) 20 MG tablet Take 20 mg by mouth daily.    [provider]  loperamide (LOPERAMIDE A-D) 2 MG tablet Take 2 mg by mouth 4 (four) times daily as needed for diarrhea or loose stools.    [provider]  metFORMIN (GLUCOPHAGE) 500 MG tablet Take 500 mg by mouth 2 (two) times daily with a meal.    [provider]  metoprolol succinate (TOPROL-XL) 50 MG 24 hr tablet Take 50 mg by mouth daily. Take with or immediately following a meal.    [provider]  Omega-3 1000 MG CAPS Take 1 capsule by mouth daily.    [provider]  oxybutynin (DITROPAN) 5 MG tablet Take 5 mg by mouth 2 (two) times daily.    [provider]  Simethicone 80 MG TABS Take 2-4 tablets by mouth daily as needed (gas).    [provider]  simvastatin (ZOCOR) 40 MG tablet Take 40 mg by mouth daily at 6 PM.    [provider]  vitamin C (ASCORBIC ACID) 250 MG tablet Take 250 mg by mouth 2 (two) times daily.    [provider]  warfarin (  COUMADIN) 5 MG tablet Take 2.5-5 mg by mouth See admin instructions. 2.5mg  on TUES and THUR,  on all other days.    [provider]    Family History No family history on file.  Social History Social History   Tobacco Use  . Smoking status: Not on file  Substance Use Topics  . Alcohol use: Not on file  . Drug use: Not on file     Allergies   Erythromycin   Review of Systems Review of Systems  Constitutional: Negative for fever.  HENT: Negative for ear pain and sore throat.   Eyes: Negative for visual disturbance.  Respiratory: Negative for cough and shortness of breath.   Cardiovascular: Negative for chest pain.  Gastrointestinal: Negative for abdominal pain, constipation, diarrhea, nausea and vomiting.   Genitourinary: Negative for dysuria and hematuria.  Musculoskeletal: Negative for back pain.  Skin: Negative for rash.  Neurological: Positive for weakness and headaches. Negative for dizziness, syncope and light-headedness.  All other systems reviewed and are negative.    Physical Exam Updated Vital Signs BP (!) 140/59   Pulse 92   Temp 99.9 F (37.7 C) (Rectal)   Resp 18   SpO2 92%   Physical Exam Vitals signs and nursing note reviewed.  Constitutional:      General: She is not in acute distress.    Appearance: She is well-developed.  HENT:     Head: Normocephalic and atraumatic.  Eyes:     Conjunctiva/sclera: Conjunctivae normal.  Neck:     Musculoskeletal: Neck supple.  Cardiovascular:     Rate and Rhythm: Normal rate and regular rhythm.     Heart sounds: Normal heart sounds. No murmur.  Pulmonary:     Effort: Pulmonary effort is normal. No respiratory distress.     Breath sounds: Normal breath sounds. No wheezing, rhonchi or rales.  Abdominal:     General: Bowel sounds are normal.     Palpations: Abdomen is soft.     Tenderness: There is no abdominal tenderness. There is no guarding or rebound.  Skin:    General: Skin is warm and dry.  Neurological:     Mental Status: She is alert.     Comments: Mental Status:  Alert, thought content appropriate, able to give a coherent history. Speech fluent without evidence of aphasia. Able to follow 2 step commands without difficulty.  Cranial Nerves:  II:  Peripheral visual fields grossly normal, pupils equal, round, reactive to light III,IV, VI: ptosis not present, extra-ocular motions intact bilaterally  V,VII: left facial droop, facial light touch sensation equal VIII: hearing grossly normal to voice  X: uvula elevates symmetrically  XI: bilateral shoulder shrug symmetric and strong XII: midline tongue extension without fassiculations Motor:  Normal tone. 5/5 strength of BUE and 4/5 strength BLE major muscle groups  including strong and equal grip strength and dorsiflexion/plantar flexion Sensory: light touch normal in all extremities. Cerebellar: normal finger-to-nose with bilateral upper extremities, normal heel to shin bilat Gait: not assessed Neg pronator drift     ED Treatments / Results  Labs (all labs ordered are listed, but only abnormal results are displayed) Labs Reviewed  BASIC METABOLIC PANEL - Abnormal; Notable for the following components:      Result Value   Glucose, Bld 103 (*)    All other components within normal limits  CBC  URINALYSIS, ROUTINE W REFLEX MICROSCOPIC  ETHANOL  PROTIME-INR  APTT  DIFFERENTIAL  RAPID URINE DRUG SCREEN, HOSP PERFORMED  CBG  MONITORING, ED    EKG EKG Interpretation  Date/Time:  Saturday February 21 2019 02:16:51 EST Ventricular Rate:  91 PR Interval:    QRS Duration: 82 QT Interval:  372 QTC Calculation: 458 R Axis:   31 Text Interpretation: Sinus rhythm Interpretation limited secondary to artifact Confirmed by Zadie RhineWickline, Donald ([id number]) on 02/21/2019 2:43:32 AM   Radiology Ct Head Wo Contrast  Result Date: 02/21/2019 CLINICAL DATA:  Initial evaluation for acute bilateral leg weakness. EXAM: CT HEAD WITHOUT CONTRAST TECHNIQUE: Contiguous axial images were obtained from the base of the skull through the vertex without intravenous contrast. COMPARISON:  Prior head CT from 10/25/2018. FINDINGS: Brain: Moderately advanced age-related cerebral atrophy with chronic small vessel ischemic disease. Remote lacunar infarct present at the anterior right basal ganglia. Focal parenchymal hemorrhage seen involving the anterior genu of the corpus callosum bilaterally, left greater than right. Hemorrhage measures approximately 2.7 x 1.2 cm in greatest dimensions. Mild posterior extension along the body of the left corpus callosum (series 6, image 38). Mild localized edema without significant regional mass effect. No appreciable intraventricular extension. No  other acute intracranial hemorrhage. No other acute large vessel territory infarct. No mass lesion or midline shift. No hydrocephalus. No extra-axial fluid collection. Vascular: No hyperdense vessel. Scattered vascular calcifications noted within the carotid siphons. Skull: Scalp soft tissues and calvarium within normal limits. Sinuses/Orbits: Globes orbital soft tissues within normal limits. Paranasal sinuses and mastoid air cells are clear. Other: None. IMPRESSION: 1. Focal parenchymal hemorrhage involving the anterior genu of the corpus callosum bilaterally, left greater than right, with posterior extension along the body of the left corpus callosum. Mild localized edema without significant regional mass effect. Finding of uncertain etiology, with primary differential considerations including an acute hemorrhagic infarct, atypical hypertensive hemorrhage, or coagulopathic bleed. No visible underlying mass lesion or vascular malformation. No other findings to suggest acute traumatic injury. 2. Moderately advanced age-related cerebral atrophy with chronic small vessel ischemic disease and remote right basal ganglia lacunar infarct. Critical Value/emergent results were called by telephone at the time of interpretation on 02/21/2019 at 1:24 am to [facility]provider[facility]ASON MESNER , who verbally acknowledged these results. Electronically Signed   By: Rise MuBenjamin  McClintock M.D.   On: 02/21/2019 01:26    Procedures Procedures (including critical care time) CRITICAL CARE Performed by: Karrie Meresortni S Deyonte Cadden   Total critical care time: 32 minutes  Critical care time was exclusive of separately billable procedures and treating other patients.  Critical care was necessary to treat or prevent imminent or life-threatening deterioration.  Critical care was time spent personally by me on the following activities: development of treatment plan with patient and/or surrogate as well as nursing, discussions with consultants, evaluation  of patient's response to treatment, examination of patient, obtaining history from patient or surrogate, ordering and performing treatments and interventions, ordering and review of laboratory studies, ordering and review of radiographic studies, pulse oximetry and re-evaluation of patient's condition.   Medications Ordered in ED Medications  nicardipine (CARDENE) 20mg  in 0.86% saline 200ml IV infusion (0.1 mg/ml) (has no administration in time range)   stroke: mapping our early stages of recovery book (has no administration in time range)  acetaminophen (TYLENOL) tablet 650 mg (has no administration in time range)    Or  acetaminophen (TYLENOL) 160 MG/5ML solution 650 mg (has no administration in time range)    Or  acetaminophen (TYLENOL) suppository 650 mg (has no administration in time range)  senna-docusate (Senokot-S) tablet 1 tablet (has no administration in time  range)  pantoprazole (PROTONIX) injection 40 mg (has no administration in time range)  phytonadione (VITAMIN K) 10 mg in dextrose 5 % 50 mL IVPB (has no administration in time range)  labetalol (NORMODYNE) injection 10 mg (10 mg Intravenous Given 02/21/19 0228)     Initial Impression / Assessment and Plan / ED Course  I have reviewed the triage vital signs and the nursing notes.  Pertinent labs & imaging results that were available during my care of the patient were reviewed by me and considered in my medical decision making (see chart for details).   Final Clinical Impressions(s) / ED Diagnoses   Final diagnoses:  Nontraumatic intracerebral hemorrhage, unspecified cerebral location, unspecified laterality (HCC)   85 y/o female presenting for eval of weakness that began yesterday. States LUE, LLE, and RLE feel weak. H/o CVA with reported residual deficits on the left side.   Pt HTN on arrival. She has a borderline temp and mild tachypnea. Pt grossly neurologically intact.   CBC, BMP reassuring.   CT personally  reviewed, showed evidence of intracerebral bleeding.   - PTINR added  - Cardene drip ordered  - neuro paged  1:48 AM CONSULT with Dr. Laurence Slate who recommended dose of IV labetalol. He will see pt in the ED and admit. He does not recommend reversal of warfarin at this time. He will f/u on INR and make decision regarding administration of vit k or other reversal agents.   Pt admitted to neurology service.   ED Discharge Orders    None       Karrie Meres, PA-C 02/21/19 0239    Karrie Meres, PA-C 02/21/19 0301    Zadie Rhine, MD 02/21/19 [id number]

## 2019-02-21 NOTE — ED Notes (Signed)
Report called to rn on 4n 

## 2019-02-21 NOTE — ED Notes (Signed)
The pt states she has a sl  headache

## 2019-02-21 NOTE — Evaluation (Signed)
Physical Therapy Evaluation Patient Details Name: Kimberly Mullins MRN: [id number] DOB: [birth year] Today's Date: 02/21/2019   History of Present Illness  Kimberly Mullins is a 85 y.o. female with past medical history significant for hypertension, diabetes mellitus, stroke with mild left-sided residual deficits presents to the emergency department with generalized weakness and difficulty walking since yesterday night. Head CT which revealed a small hemorrhage in the genu of the corpus callosum bilaterally with mild localized edema but no mass-effect or intraventricular hemorrhage.    Clinical Impression  Pt admitted with above. Prior to admission, pt lives in an assisted living, is independent with ADL's, and uses a Rollator for mobility. On PT evaluation, pt presents with abnormal posture, generalized weakness (left lower extremity noted to be weaker than right), poor sitting/standing balance, and right knee pain. Pt requiring up to two person maximal assist to sit up on edge of bed and to stand from edge of bed. Unable to progress to ambulation due to posterior lean and feet subsequently sliding anteriorly. BP 131/56 sitting edge of bed and 118/52 post mobility. Suspect pt will need increased level of care, therefore recommending SNF at discharge.     Follow Up Recommendations SNF;Supervision/Assistance - 24 hour    Equipment Recommendations  Wheelchair (measurements PT);3in1 (PT)    Recommendations for Other Services       Precautions / Restrictions Precautions Precautions: Fall Restrictions Weight Bearing Restrictions: No      Mobility  Bed Mobility Overal bed mobility: Needs Assistance Bed Mobility: Supine to Sit;Sit to Supine     Supine to sit: +2 for physical assistance;Mod assist Sit to supine: Max assist;+2 for physical assistance   General bed mobility comments: Pt able to progress BLE's to edge of bed very slowly with increased time. Requiring modA + 2 for trunk elevation  and use of bed pad to slide hips forward to edge. MaxA + 2 to progress back to supine due to sliding anteriorly on bed  Transfers Overall transfer level: Needs assistance Equipment used: Rolling walker (2 wheeled) Transfers: Sit to/from Stand Sit to Stand: Max assist;+2 physical assistance         General transfer comment: MaxA + 2 to boost up to stand, pt at a height disadvantage with tall bed. Cues for hand placement, anterior foot slide and decreased ability to perform anterior weight shift   Ambulation/Gait             General Gait Details: unable  Stairs            Wheelchair Mobility    Modified Rankin (Stroke Patients Only) Modified Rankin (Stroke Patients Only) Pre-Morbid Rankin Score: Moderate disability Modified Rankin: Severe disability     Balance Overall balance assessment: Needs assistance Sitting-balance support: Feet unsupported;Bilateral upper extremity supported Sitting balance-Leahy Scale: Poor Sitting balance - Comments: reliant on bilateral hand support     Standing balance-Leahy Scale: Zero Standing balance comment: requires maxA to maintain upright, heavy posterior lean                             Pertinent Vitals/Pain Pain Assessment: Faces Faces Pain Scale: Hurts even more Pain Location: right knee Pain Descriptors / Indicators: Grimacing;Guarding;Aching Pain Intervention(s): Limited activity within patient's tolerance;Monitored during session;Premedicated before session;Repositioned    Home Living Family/patient expects to be discharged to:: Assisted living               Home Equipment: Walker - 4  wheels;Transport chair      Prior Function Level of Independence: Independent with assistive device(s)         Comments: Use of Rollator for ambulation     Hand Dominance        Extremity/Trunk Assessment   Upper Extremity Assessment Upper Extremity Assessment: Generalized weakness    Lower Extremity  Assessment Lower Extremity Assessment: Generalized weakness(L weaker than R)    Cervical / Trunk Assessment Cervical / Trunk Assessment: Kyphotic  Communication   Communication: No difficulties  Cognition Arousal/Alertness: Awake/alert Behavior During Therapy: WFL for tasks assessed/performed Overall Cognitive Status: Impaired/Different from baseline Area of Impairment: Safety/judgement                         Safety/Judgement: Decreased awareness of deficits     General Comments: A&Ox4, decreased awareness of deficits and functional impact      General Comments      Exercises     Assessment/Plan    PT Assessment Patient needs continued PT services  PT Problem List Decreased strength;Decreased activity tolerance;Decreased balance;Decreased mobility;Decreased coordination;Decreased cognition;Decreased safety awareness;Pain       PT Treatment Interventions DME instruction;Gait training;Functional mobility training;Therapeutic activities;Therapeutic exercise;Balance training;Patient/family education;Wheelchair mobility training    PT Goals (Current goals can be found in the Care Plan section)  Acute Rehab PT Goals Patient Stated Goal: "less knee pain." PT Goal Formulation: With patient Time For Goal Achievement: 03/07/19 Potential to Achieve Goals: Fair    Frequency Min 3X/week   Barriers to discharge        Co-evaluation               AM-PAC PT "6 Clicks" Mobility  Outcome Measure Help needed turning from your back to your side while in a flat bed without using bedrails?: A Lot Help needed moving from lying on your back to sitting on the side of a flat bed without using bedrails?: Total Help needed moving to and from a bed to a chair (including a wheelchair)?: Total Help needed standing up from a chair using your arms (e.g., wheelchair or bedside chair)?: Total Help needed to walk in hospital room?: Total Help needed climbing 3-5 steps with a  railing? : Total 6 Click Score: 7    End of Session Equipment Utilized During Treatment: Gait belt Activity Tolerance: Patient tolerated treatment well Patient left: in bed;with call bell/phone within reach;with bed alarm set Nurse Communication: Mobility status PT Visit Diagnosis: Other abnormalities of gait and mobility (R26.89);Muscle weakness (generalized) (M62.81);Difficulty in walking, not elsewhere classified (R26.2);Other symptoms and signs involving the nervous system (R29.898)    Time: 7681-1572 PT Time Calculation (min) (ACUTE ONLY): 46 min   Charges:   PT Evaluation $PT Eval Moderate Complexity: 1 Mod PT Treatments $Therapeutic Activity: 23-37 mins        Laurina Bustle, PT, DPT Acute Rehabilitation Services Pager [phone] Office [phone]   Vanetta Mulders 02/21/2019, 1:20 PM

## 2019-02-21 NOTE — Consult Note (Addendum)
Chief Complaint: Acute bilateral leg weakness  History obtained from: Patient and Chart    HPI:                                                                                                                                       Kimberly Mullins is a 85 y.o. female with past medical history significant for hypertension, diabetes mellitus, stroke with mild left-sided residual deficits presents to the emergency department with generalized weakness and difficulty walking since yesterday night.  Patient presented to the emergency department today afternoon.  After long wait in the ER, patient underwent head CT which revealed a small hemorrhage in the genu of the corpus callosum bilaterally with mild localized edema but no mass-effect or intraventricular hemorrhage.  Blood pressure was noted to be greater than 180 in the ER and patient was given labetalol and started on Cardene drip.  Her PT/INR was checked and is pending.  Given patient has been relatively stable without any worsening symptoms since the last 24 hours we will hold off reversal with Kcentra as at this time risk of thrombotic events outweighs benefit.  Date last known well: 12.3.20 tPA Given: No, hemorrhage NIHSS: 2 Baseline MRS 1  Intracerebral Hemorrhage (ICH) Score  Glascow Coma Score  3-15 0  Age >/= 80 yes +1  ICH volume >/= 33ml  Yes no 0  IVH yes no 0  Infratentorial origin no 0 Total: 1   Past Medical History:  Diagnosis Date  . Diabetes mellitus without complication ([place])   . Hypertension   . Stroke [facility])     Past Surgical History:  Procedure Laterality Date  . APPENDECTOMY      No family history on file. Social History:  has no history on file for tobacco, alcohol, and drug.  Allergies:  Allergies  Allergen Reactions  . Erythromycin Hives    Medications:                                                                                                                        I reviewed home  medications.  Patient is on Coumadin   ROS:  14 systems reviewed and negative except above  Examination:                                                                                                      General: Appears well-developed  Psych: Affect appropriate to situation Eyes: No scleral injection HENT: No OP obstrucion Head: Normocephalic.  Cardiovascular: Normal rate and regular rhythm. Respiratory: Effort normal and breath sounds normal to anterior ascultation GI: Soft.  No distension. There is no tenderness.  Skin: WDI    Neurological Examination Mental Status: Alert, oriented, thought content appropriate.  Speech fluent without evidence of aphasia. Able to follow 3 step commands without difficulty. Cranial Nerves: II: Visual fields grossly normal,  III,IV, VI: ptosis not present, extra-ocular motions intact bilaterally, pupils equal, round, reactive to light and accommodation V,VII: smile symmetric, facial light touch sensation normal bilaterally VIII: hearing normal bilaterally IX,X: uvula rises symmetrically XI: bilateral shoulder shrug XII: midline tongue extension Motor: Right : Upper extremity   5/5    Left:     Upper extremity   5/5  Lower extremity   4/5     Lower extremity   4/5 Tone and bulk:normal tone throughout; no atrophy noted Sensory: Pinprick and light touch intact throughout, bilaterally Plantars: Right: downgoing   Left: downgoing Cerebellar: normal finger-to-nose, normal rapid alternating movements and normal heel-to-shin test Gait: Did not assess due to safety     Lab Results: Basic Metabolic Panel: Recent Labs  Lab 02/20/19 1403  NA 135  K 3.7  CL 101  CO2 23  GLUCOSE 103*  BUN 14  CREATININE 0.70  CALCIUM 9.7    CBC: Recent Labs  Lab 02/20/19 1403  WBC 8.9  HGB 12.4  HCT 38.3  MCV 90.5  PLT  254    Coagulation Studies: No results for input(s): LABPROT, INR in the last 72 hours.  Imaging: Ct Head Wo Contrast  Result Date: 02/21/2019 CLINICAL DATA:  Initial evaluation for acute bilateral leg weakness. EXAM: CT HEAD WITHOUT CONTRAST TECHNIQUE: Contiguous axial images were obtained from the base of the skull through the vertex without intravenous contrast. COMPARISON:  Prior head CT from 10/25/2018. FINDINGS: Brain: Moderately advanced age-related cerebral atrophy with chronic small vessel ischemic disease. Remote lacunar infarct present at the anterior right basal ganglia. Focal parenchymal hemorrhage seen involving the anterior genu of the corpus callosum bilaterally, left greater than right. Hemorrhage measures approximately 2.7 x 1.2 cm in greatest dimensions. Mild posterior extension along the body of the left corpus callosum (series 6, image 38). Mild localized edema without significant regional mass effect. No appreciable intraventricular extension. No other acute intracranial hemorrhage. No other acute large vessel territory infarct. No mass lesion or midline shift. No hydrocephalus. No extra-axial fluid collection. Vascular: No hyperdense vessel. Scattered vascular calcifications noted within the carotid siphons. Skull: Scalp soft tissues and calvarium within normal limits. Sinuses/Orbits: Globes orbital soft tissues within normal limits. Paranasal sinuses and mastoid air cells are clear. Other: None. IMPRESSION: 1. Focal parenchymal hemorrhage involving the anterior genu of the corpus callosum bilaterally, left greater than  right, with posterior extension along the body of the left corpus callosum. Mild localized edema without significant regional mass effect. Finding of uncertain etiology, with primary differential considerations including an acute hemorrhagic infarct, atypical hypertensive hemorrhage, or coagulopathic bleed. No visible underlying mass lesion or vascular malformation. No  other findings to suggest acute traumatic injury. 2. Moderately advanced age-related cerebral atrophy with chronic small vessel ischemic disease and remote right basal ganglia lacunar infarct. Critical Value/emergent results were called by telephone at the time of interpretation on 02/21/2019 at 1:24 am to [facility]providerJASON MESNER , who verbally acknowledged these results. Electronically Signed   By: Rise MuBenjamin  McClintock M.D.   On: 02/21/2019 01:26     ASSESSMENT AND PLAN    Frontal intracerebral hemorrhage in the setting of hypertensive emergency and anticoagulation with Coumadin  Plan: # MRI of the brain without contrast #Discontinue Coumadin, Vitamin K 10mg  IV once # BP goal: <140/90 mmHg # Telemetry monitoring # Frequent neuro checks # NPO until passes stroke swallow screen   Diabetes mellitus -We will check A1c -Fingersticks after meals and HS  Hypertensive emergency -Patient on Cardene drip, goal less than 140 systolic -Can resume p.o. meds in the morning once she passes swallow eval  Hyperlipidemia -Continue statin    This patient is neurologically critically ill due to ICH.  She is at risk for significant risk of neurological worsening from worsening hemorrhage, cerebral edema,  death from brain herniation, heart failure,  infection, respiratory failure and seizure. This patient's care requires constant monitoring of vital signs, hemodynamics, respiratory and cardiac monitoring, review of multiple databases, neurological assessment, discussion with family, other specialists and medical decision making of high complexity.  I spent 50  minutes of neurocritical time in the care of this patient.     Please page stroke NP  Or  PA  Or MD from 8am -4 pm  as this patient from this time will be  followed by the stroke.   You can look them up on [email or web address]  Password [email or web address]   Tiana Sivertson [facility] Pager Number [phone]

## 2019-02-21 NOTE — Progress Notes (Signed)
Patient admitted to [address]N [address] with a belongings back and a black purse.  -Clothes and shoes  -Inside black purse: 61$ in an envelope, 105$ in a black wallet, ID, bank card, insurance cards, Red flip cell phone and charger.  -On person: glasses, gold band on ring finger, and silver watch.  Patient wants to keep her belongings at bedside and her purse in her bed with her until her family comes back today. Patient made aware that she will be responsible for her belongings.

## 2019-02-21 NOTE — Progress Notes (Signed)
OT Cancellation Note  Patient Details Name: Kimberly Mullins MRN: [id number] DOB: 02-09-1932   Cancelled Treatment:    Reason Eval/Treat Not Completed: Active bedrest order. Will return as schedule allows.  [place], OTR/L Acute Rehab Pager: [phone] Office: [phone] 02/21/2019, 6:52 AM

## 2019-02-21 NOTE — Progress Notes (Signed)
STROKE TEAM PROGRESS NOTE   HISTORY OF PRESENT ILLNESS (per record) Kimberly Mullins is a 85 y.o. female with past medical history significant for hypertension, diabetes mellitus, stroke with mild left-sided residual deficits presents to the emergency department with generalized weakness and difficulty walking since yesterday night.  Patient presented to the emergency department today afternoon.  After long wait in the ER, patient underwent head CT which revealed a small hemorrhage in the genu of the corpus callosum bilaterally with mild localized edema but no mass-effect or intraventricular hemorrhage.  Blood pressure was noted to be greater than 180 in the ER and patient was given labetalol and started on Cardene drip.  Her PT/INR was checked and is pending.  Given patient has been relatively stable without any worsening symptoms since the last 24 hours we will hold off reversal with Kcentra as at this time risk of thrombotic events outweighs benefit.  Date last known well: 12.3.20 tPA Given: No, hemorrhage NIHSS: 2 Baseline MRS 1  Intracerebral Hemorrhage (ICH) Score  Glascow Coma Score  3-15 0  Age >/= 80 yes +1  ICH volume >/= 30ml  Yes no 0  IVH yes no 0  Infratentorial origin no 0 Total: 1   INTERVAL HISTORY She is alert and awake, sitting in bed comfortably. We will let her get out of bed with PT today. She is on Cleviprex we will restart oral home meds and monitor. Hold AC until blood resolves.     OBJECTIVE Vitals:   02/21/19 0615 02/21/19 0630 02/21/19 0645 02/21/19 0700  BP: (!) 121/50 140/65 (!) 128/54 (!) 124/59  Pulse: 83 100 95 88  Resp: 17 (!) 23 17 15   Temp:      TempSrc:      SpO2: 90% 94% 94% (!) 88%  Weight:      Height:        CBC:  Recent Labs  Lab 02/20/19 1403 02/21/19 0333  WBC 8.9 11.3*  NEUTROABS  --  7.3  HGB 12.4 12.4  HCT 38.3 37.6  MCV 90.5 89.5  PLT 254 231    Basic Metabolic Panel:  Recent Labs  Lab 02/20/19 1403  NA  135  K 3.7  CL 101  CO2 23  GLUCOSE 103*  BUN 14  CREATININE 0.70  CALCIUM 9.7    Lipid Panel: No results found for: CHOL, TRIG, HDL, CHOLHDL, VLDL, LDLCALC HgbA1c: No results found for: HGBA1C Urine Drug Screen: No results found for: LABOPIA, COCAINSCRNUR, LABBENZ, AMPHETMU, THCU, LABBARB  Alcohol Level No results found for: ETH  IMAGING   Ct Head Wo Contrast 02/21/2019 IMPRESSION:  1. Focal parenchymal hemorrhage involving the anterior genu of the corpus callosum bilaterally, left greater than right, with posterior extension along the body of the left corpus callosum. Mild localized edema without significant regional mass effect. Finding of uncertain etiology, with primary differential considerations including an acute hemorrhagic infarct, atypical hypertensive hemorrhage, or coagulopathic bleed. No visible underlying mass lesion or vascular malformation. No other findings to suggest acute traumatic injury.  2. Moderately advanced age-related cerebral atrophy with chronic small vessel ischemic disease and remote right basal ganglia lacunar infarct.    ECG - SR rate 91 BPM. (See cardiology reading for complete details)   PHYSICAL EXAM Blood pressure (!) 124/59, pulse 88, temperature 99.4 F (37.4 C), temperature source Oral, resp. rate 15, height 5\' 3"  (1.6 m), weight 68.7 kg, SpO2 (!) 88 %.   Exam: NAD, pleasant  Speech:    Speech is normal; fluent and spontaneous with normal comprehension.  Cognition:    The patient is oriented to person, place, and time; Able to follow commands.  language fluent;    Cranial Nerves:    The pupils are equal, round, and reactive to light.Trigeminal sensation is intact and the muscles of mastication are normal. The face is symmetric. The palate elevates in the midline. Hearing intact. Voice is normal. Shoulder shrug is normal. The tongue has normal motion without fasciculations.   Coordination:  No dysmetria  Motor  Observation:    No asymmetry, no atrophy, and no involuntary movements noted. Tone:    Normal muscle tone.     Strength: Very mild left-sided hemiparesis otherwise strength is V/V in the upper and lower limbs.      Sensation: intact to LT  ASSESSMENT/PLAN Kimberly Mullins is a lovely 85 y.o. female with history of hypertension, diabetes mellitus, previous stroke with mild left-sided residual deficits, and Coumadin PTA (INR 2.0) presents to the emergency department with generalized weakness and difficulty walking. She did not receive IV t-PA due to hemorrhage.  Stroke: Focal parenchymal hemorrhage involving the anterior genu of the corpus callosum bilaterally due to hypertensive emergency  Code Stroke CT Head - not ordered  CT head - Focal parenchymal hemorrhage involving the anterior genu of the corpus callosum bilaterally, left greater than right, with posterior extension along the body of the left corpus callosum.  MRI head - not ordered  MRA head - not ordered  CTA H&N - not ordered  CT Perfusion - not ordered  Carotid Doppler - not ordered  2D Echo - not ordered  [employer] Virus 2 - pending  LDL - not ordered  HgbA1c - will order - pending  UDS - pending  VTE prophylaxis - SCDs Diet  Diet Order            Diet Carb Modified Fluid consistency: Thin; Room service appropriate? Yes with Assist  Diet effective now              warfarin daily prior to admission, now on No antithrombotic  Ongoing aggressive stroke risk factor management  Therapy recommendations:  pending  Disposition:  Pending  Hypertension  Home BP meds: Zestril ; Toprol (resume Toprol and  Zestril - restart at lower doses)  Current BP meds: Cardene ; Labetalol  Stable . SBP goal < 140 mm Hg initially . Long-term BP goal normotensive  Hyperlipidemia  Home Lipid lowering medication: Zocor 40 mg daily  LDL - not ordered, goal < 70  Current lipid lowering medication: None  (statin contraindicated with ICH)  Resume statin at discharge  Diabetes  Home diabetic meds: glucophage  Current diabetic meds: none - may need SSI  HgbA1c - pending, goal < 7.0 Recent Labs    02/20/19 1359  GLUCAP 95    Other Stroke Risk Factors  Advanced age  Hx stroke/TIA - remote right basal ganglia lacunar infarct.   Atrial fibrillation (warfarin PTA)? Need to clarify why on Warfarin. Will hold Warfarin until bleed resolution. May not restart depending on reason.  Other Active Problems  Mild Leukocytosis - 8.9->11.3 (temp - 99.4) U/A - pending - Will order CXR.  Warfarin (INR 2.0) -> vitamin K - Repeat Protime at 12:30 today   Hospital day # 0  I really lovely 85 year old female here with intracranial hemorrhage in the setting of warfarin use and hypertensive emergency.  I am not quite sure  why she is on warfarin whether it is for A. fib or otherwise.  At this time we will hold and need to clarify why she is on warfarin.  Personally examined patient and images, and have participated in and made any corrections needed to history, physical, neuro exam,assessment and plan as stated above.  I have personally obtained the history, evaluated lab date, reviewed imaging studies and agree with radiology interpretations.    Sarina Ill, MD Stroke Neurology   A total of 35 minutes was spent for the care of this patient, spent on counseling patient and family on different diagnostic and therapeutic options, counseling and coordination of care, riskd ans benefits of management, compliance, or risk factor reduction and education.   To contact Stroke Continuity provider, please refer to [email or web address] After hours, contact General Neurology

## 2019-02-22 LAB — BASIC METABOLIC PANEL
Anion gap: 10 (ref 5–15)
BUN: 22 mg/dL (ref 8–23)
CO2: 21 mmol/L — ABNORMAL LOW (ref 22–32)
Calcium: 8.6 mg/dL — ABNORMAL LOW (ref 8.9–10.3)
Chloride: 105 mmol/L (ref 98–111)
Creatinine, Ser: 0.76 mg/dL (ref 0.44–1.00)
GFR calc Af Amer: >60 mL/min (ref >60)
GFR calc non Af Amer: >60 mL/min (ref >60)
Glucose, Bld: 116 mg/dL — ABNORMAL HIGH (ref 70–99)
Potassium: 3.4 mmol/L — ABNORMAL LOW (ref 3.5–5.1)
Sodium: 136 mmol/L (ref 135–145)

## 2019-02-22 LAB — GLUCOSE, CAPILLARY
Glucose-Capillary: 118 mg/dL — ABNORMAL HIGH (ref 70–99)
Glucose-Capillary: 114 mg/dL — ABNORMAL HIGH (ref 70–99)
Glucose-Capillary: 130 mg/dL — ABNORMAL HIGH (ref 70–99)
Glucose-Capillary: 113 mg/dL — ABNORMAL HIGH (ref 70–99)
Glucose-Capillary: 131 mg/dL — ABNORMAL HIGH (ref 70–99)

## 2019-02-22 LAB — CBC
HCT: 30.9 % — ABNORMAL LOW (ref 36.0–46.0)
Hemoglobin: 10.2 g/dL — ABNORMAL LOW (ref 12.0–15.0)
MCH: 29.5 pg (ref 26.0–34.0)
MCHC: 33 g/dL (ref 30.0–36.0)
MCV: 89.3 fL (ref 80.0–100.0)
Platelets: 174 10*3/uL (ref 150–400)
RBC: 3.46 MIL/uL — ABNORMAL LOW (ref 3.87–5.11)
RDW: 13.2 % (ref 11.5–15.5)
WBC: 9.5 10*3/uL (ref 4.0–10.5)
nRBC: 0 % (ref 0.0–0.2)

## 2019-02-22 MED ORDER — LABETALOL HCL 5 MG/ML IV SOLN
20.0000 mg | INTRAVENOUS | Status: DC | PRN
  Administered 2019-02-22 – 2019-02-27 (×4): 20 mg via INTRAVENOUS
  Filled 2019-02-22 (×5): qty 4

## 2019-02-22 MED ORDER — LISINOPRIL 10 MG PO TABS
10.0000 mg | ORAL_TABLET | ORAL | Status: AC
  Administered 2019-02-22: 17:00:00 10 mg via ORAL
  Filled 2019-02-22: qty 1

## 2019-02-22 MED ORDER — METOPROLOL SUCCINATE ER 25 MG PO TB24
50.0000 mg | ORAL_TABLET | Freq: Every day | ORAL | Status: DC
  Administered 2019-02-23 – 2019-02-25 (×3): 50 mg via ORAL
  Filled 2019-02-22 (×2): qty 1
  Filled 2019-02-22: qty 2

## 2019-02-22 MED ORDER — LISINOPRIL 20 MG PO TABS
30.0000 mg | ORAL_TABLET | Freq: Every day | ORAL | Status: DC
  Administered 2019-02-23 – 2019-02-25 (×3): 30 mg via ORAL
  Filled 2019-02-22 (×3): qty 1

## 2019-02-22 MED ORDER — SODIUM CHLORIDE 0.9 % IV SOLN
3.0000 g | Freq: Three times a day (TID) | INTRAVENOUS | Status: DC
  Administered 2019-02-22 – 2019-02-27 (×17): 3 g via INTRAVENOUS
  Filled 2019-02-22: qty 3
  Filled 2019-02-22: qty 8
  Filled 2019-02-22 (×4): qty 3
  Filled 2019-02-22: qty 8
  Filled 2019-02-22: qty 3
  Filled 2019-02-22: qty 8
  Filled 2019-02-22: qty 3
  Filled 2019-02-22: qty 8
  Filled 2019-02-22: qty 3
  Filled 2019-02-22: qty 8
  Filled 2019-02-22 (×6): qty 3
  Filled 2019-02-22: qty 8

## 2019-02-22 NOTE — Evaluation (Signed)
Occupational Therapy Evaluation Patient Details Name: Kimberly PlantBarbara T Mullins MRN: [id number] DOB: 01/28/1932 Today's Date: 02/22/2019    History of Present Illness Kimberly Mullins is a 85 y.o. female with past medical history significant for hypertension, diabetes mellitus, stroke with mild left-sided residual deficits presents to the emergency department with generalized weakness and difficulty walking since yesterday night. Head CT which revealed a small hemorrhage in the genu of the corpus callosum bilaterally with mild localized edema but no mass-effect or intraventricular hemorrhage.     Clinical Impression   Pt PTA: living in ILF with ability to ambulate and care for self. Pt currently with decreased strength, decreased activity tolerance, and poor ability to care for self. Pt maxA for bed mobility;transfers with maxA using RW and taking small steps. RLE pain. Pt requires maxA for power up with posterior lean; pt given a few seconds to put feet on floor prior to taking any steps. Pt set-upA for grooming in sitting maxA overall for ADL tasks. Pt would benefit from continued OT skilled services. OT following acutely.  118/49 (70) 103 BPM, 93% SPO2 on RA post exertion.    Follow Up Recommendations  SNF;Supervision/Assistance - 24 hour    Equipment Recommendations  3 in 1 bedside commode    Recommendations for Other Services       Precautions / Restrictions Precautions Precautions: Fall Restrictions Weight Bearing Restrictions: No      Mobility Bed Mobility Overal bed mobility: Needs Assistance Bed Mobility: Supine to Sit     Supine to sit: Max assist     General bed mobility comments: R LE is sore; use of bed pad to scoop pt to EOB and pt moving LLE to EOB  Transfers Overall transfer level: Needs assistance Equipment used: Rolling walker (2 wheeled) Transfers: Sit to/from UGI CorporationStand;Stand Pivot Transfers Sit to Stand: Max assist Stand pivot transfers: Mod assist;Max assist       General transfer comment: MaxA for power up with posterior lean; pt given a few seconds to put feet on floor prior to taking any steps.    Balance Overall balance assessment: Needs assistance Sitting-balance support: Feet unsupported;Bilateral upper extremity supported Sitting balance-Leahy Scale: Poor     Standing balance support: Bilateral upper extremity supported Standing balance-Leahy Scale: Poor Standing balance comment: requires modA for initial standing balance and then pt able to take steps                           ADL either performed or assessed with clinical judgement   ADL Overall ADL's : Needs assistance/impaired Eating/Feeding: Modified independent;Sitting   Grooming: Set up;Sitting   Upper Body Bathing: Set up;Sitting   Lower Body Bathing: Maximal assistance;Sitting/lateral leans;Sit to/from stand   Upper Body Dressing : Set up;Sitting   Lower Body Dressing: Maximal assistance;Cueing for safety;Cueing for sequencing;Sitting/lateral leans;Sit to/from stand   Toilet Transfer: Moderate assistance;Stand-pivot;Cueing for safety;Cueing for sequencing;BSC;RW   Toileting- Clothing Manipulation and Hygiene: Maximal assistance;Cueing for safety;Cueing for sequencing;Sitting/lateral lean;Sit to/from stand;+2 for physical assistance;+2 for safety/equipment       Functional mobility during ADLs: Moderate assistance;Maximal assistance;Cueing for safety;Cueing for sequencing;Rolling walker General ADL Comments: Pt with decreased strength, decreased activity tolerance, and poor ability to care for self.     Vision Baseline Vision/History: Wears glasses Wears Glasses: At all times Patient Visual Report: No change from baseline Vision Assessment?: No apparent visual deficits     Perception     Praxis  Pertinent Vitals/Pain Pain Assessment: Faces Faces Pain Scale: Hurts even more Pain Location: right knee Pain Descriptors / Indicators:  Grimacing;Guarding;Aching Pain Intervention(s): Limited activity within patient's tolerance     Hand Dominance Right   Extremity/Trunk Assessment Upper Extremity Assessment Upper Extremity Assessment: Generalized weakness   Lower Extremity Assessment Lower Extremity Assessment: Generalized weakness   Cervical / Trunk Assessment Cervical / Trunk Assessment: Kyphotic   Communication Communication Communication: No difficulties   Cognition Arousal/Alertness: Awake/alert Behavior During Therapy: WFL for tasks assessed/performed Overall Cognitive Status: Impaired/Different from baseline Area of Impairment: Safety/judgement                         Safety/Judgement: Decreased awareness of deficits     General Comments: A/Ox4; pt starting to learn functionl impact of deficits   General Comments  118/49 (70) 103 BPM, 93% SPO2 on RA post exertion    Exercises     Shoulder Instructions      Home Living Family/patient expects to be discharged to:: Assisted living     Type of Home: Assisted living                       Home Equipment: Walker - 4 wheels;Transport chair          Prior Functioning/Environment Level of Independence: Independent with assistive device(s)        Comments: Use of Rollator for ambulation; performing own ADL        OT Problem List: Decreased strength;Decreased activity tolerance;Impaired balance (sitting and/or standing);Decreased safety awareness;Impaired UE functional use      OT Treatment/Interventions: Self-care/ADL training;Therapeutic exercise;Energy conservation;DME and/or AE instruction;Neuromuscular education;Therapeutic activities;Cognitive remediation/compensation;Patient/family education;Balance training    OT Goals(Current goals can be found in the care plan section) Acute Rehab OT Goals Patient Stated Goal: to go home OT Goal Formulation: With patient Time For Goal Achievement: 03/08/19 Potential to  Achieve Goals: Good ADL Goals Pt Will Perform Grooming: with supervision;standing Pt Will Perform Lower Body Dressing: with min assist;sitting/lateral leans;sit to/from stand Pt Will Transfer to Toilet: with min guard assist;ambulating;bedside commode Pt Will Perform Toileting - Clothing Manipulation and hygiene: with min guard assist;sitting/lateral leans;sit to/from stand Pt/caregiver will Perform Home Exercise Program: Increased strength;Both right and left upper extremity;With Supervision  OT Frequency: Min 2X/week   Barriers to D/C:            Co-evaluation              AM-PAC OT "6 Clicks" Daily Activity     Outcome Measure Help from another person eating meals?: None Help from another person taking care of personal grooming?: A Little Help from another person toileting, which includes using toliet, bedpan, or urinal?: A Lot Help from another person bathing (including washing, rinsing, drying)?: A Lot Help from another person to put on and taking off regular upper body clothing?: A Lot Help from another person to put on and taking off regular lower body clothing?: Total 6 Click Score: 14   End of Session Equipment Utilized During Treatment: Gait belt;Rolling walker Nurse Communication: Mobility status  Activity Tolerance: Patient tolerated treatment well Patient left: in chair;with call bell/phone within reach;with chair alarm set  OT Visit Diagnosis: Unsteadiness on feet (R26.81);Muscle weakness (generalized) (M62.81)                Time: 9163-8466 OT Time Calculation (min): 39 min Charges:  OT General Charges $OT Visit: 1 Visit OT Evaluation $OT  Eval Moderate Complexity: 1 Mod OT Treatments $Self Care/Home Management : 8-22 mins $Therapeutic Activity: 8-22 mins  Darryl Nestle) Marsa Aris OTR/L Acute Rehabilitation Services Pager: [phone] Office: [place] 02/22/2019, 2:41 PM

## 2019-02-22 NOTE — Progress Notes (Signed)
STROKE TEAM PROGRESS NOTE   HISTORY OF PRESENT ILLNESS (per record) Kimberly Mullins is a 85 y.o. female with past medical history significant for hypertension, diabetes mellitus, stroke with mild left-sided residual deficits presents to the emergency department with generalized weakness and difficulty walking since yesterday night.  Patient presented to the emergency department today afternoon.  After long wait in the ER, patient underwent head CT which revealed a small hemorrhage in the genu of the corpus callosum bilaterally with mild localized edema but no mass-effect or intraventricular hemorrhage.  Blood pressure was noted to be greater than 180 in the ER and patient was given labetalol and started on Cardene drip.  Her PT/INR was checked and is pending.  Given patient has been relatively stable without any worsening symptoms since the last 24 hours we will hold off reversal with Kcentra as at this time risk of thrombotic events outweighs benefit.  Date last known well: 12.3.20 tPA Given: No, hemorrhage NIHSS: 2 Baseline MRS 1  Intracerebral Hemorrhage (ICH) Score  Glascow Coma Score  3-15 0  Age >/= 80 yes +1  ICH volume >/= 30ml  Yes no 0  IVH yes no 0  Infratentorial origin no 0 Total: 1   INTERVAL HISTORY She is alert and awake, sitting in bed comfortably. Off of Cleviprex. We discussed Warfarin, she said she had a "clot in her heart" and after last stroke she was started on Warfarin. Hold AC until blood resolves. Discussed that she needs to closely manage her HTN.   OBJECTIVE Vitals:   02/22/19 0430 02/22/19 0445 02/22/19 0500 02/22/19 0515  BP: (!) 123/55 136/64 (!) 128/52 (!) 114/49  Pulse: 94 (!) 103 95 90  Resp: 19 (!) 24 (!) 22 15  Temp:      TempSrc:      SpO2: 96% 94% 94% 96%  Weight:      Height:        CBC:  Recent Labs  Lab 02/20/19 1403 02/21/19 0333  WBC 8.9 11.3*  NEUTROABS  --  7.3  HGB 12.4 12.4  HCT 38.3 37.6  MCV 90.5 89.5  PLT 254  231    Basic Metabolic Panel:  Recent Labs  Lab 02/20/19 1403  NA 135  K 3.7  CL 101  CO2 23  GLUCOSE 103*  BUN 14  CREATININE 0.70  CALCIUM 9.7    Lipid Panel: No results found for: CHOL, TRIG, HDL, CHOLHDL, VLDL, LDLCALC HgbA1c:  Lab Results  Component Value Date   HGBA1C 6.1 (H) 02/21/2019   Urine Drug Screen: No results found for: LABOPIA, COCAINSCRNUR, LABBENZ, AMPHETMU, THCU, LABBARB  Alcohol Level     Component Value Date/Time   ETH <10 02/21/2019 0558    IMAGING  Ct Head Wo Contrast 02/21/2019 IMPRESSION:  1. Focal parenchymal hemorrhage involving the anterior genu of the corpus callosum bilaterally, left greater than right, with posterior extension along the body of the left corpus callosum. Mild localized edema without significant regional mass effect. Finding of uncertain etiology, with primary differential considerations including an acute hemorrhagic infarct, atypical hypertensive hemorrhage, or coagulopathic bleed. No visible underlying mass lesion or vascular malformation. No other findings to suggest acute traumatic injury.  2. Moderately advanced age-related cerebral atrophy with chronic small vessel ischemic disease and remote right basal ganglia lacunar infarct.   Portable Chest - 1 View 02/21/2019 IMPRESSION: Potential pneumonia and or effusion in the left lung base.   ECG - SR rate 91 BPM. (See cardiology reading for  complete details)   PHYSICAL EXAM Blood pressure (!) 114/49, pulse 90, temperature 98.1 F (36.7 C), temperature source Oral, resp. rate 15, height 5\' 3"  (1.6 m), weight 68.7 kg, SpO2 96 %.  Exam: NAD, pleasant                  Speech:    Speech is normal; fluent and spontaneous with normal comprehension.  Cognition:    The patient is oriented to person, place, and time; Able to follow commands.  language fluent;    Cranial Nerves:    The pupils are equal, round, and reactive to light.Trigeminal sensation is intact and the  muscles of mastication are normal. The face is symmetric. The palate elevates in the midline. Hearing intact. Voice is normal. Shoulder shrug is normal. The tongue has normal motion without fasciculations.   Coordination:  No dysmetria  Motor Observation:    No asymmetry, no atrophy, and no involuntary movements noted. Tone:    Normal muscle tone.     Strength: Very mild left-sided hemiparesis otherwise strength is V/V in the upper and lower limbs.      Sensation: intact to LT  ASSESSMENT/PLAN Kimberly Mullins is a lovely 85 y.o. female with history of hypertension, diabetes mellitus, previous stroke with mild left-sided residual deficits, and Coumadin PTA (INR 2.0) presents to the emergency department with generalized weakness and difficulty walking. She did not receive IV t-PA due to hemorrhage.  Stroke: Focal parenchymal hemorrhage involving the anterior genu of the corpus callosum bilaterally due to hypertensive emergency  Code Stroke CT Head - not ordered  CT head - Focal parenchymal hemorrhage involving the anterior genu of the corpus callosum bilaterally, left greater than right, with posterior extension along the body of the left corpus callosum.  MRI head - not ordered  MRA head - not ordered  CTA H&N - not ordered  CT Perfusion - not ordered  Carotid Doppler - not ordered  2D Echo - not ordered  [employer] Virus 2 - negative  LDL - not ordered  HgbA1c - 6.1  UDS - pending  VTE prophylaxis - SCDs Diet  Diet Order            Diet Carb Modified Fluid consistency: Thin; Room service appropriate? Yes with Assist  Diet effective now              warfarin daily prior to admission, now on No antithrombotic. Can consider restarting Warfarin when blood resolves however given her age need a discussion on whether to continue Warfarin after bleed. May have been due to hypertensive urgency, monitor blood pressure and if can control more aggressively may consider  restarting.  Ongoing aggressive stroke risk factor management  Therapy recommendations:  SNF  Disposition:  Pending  Hypertension  Home BP meds: Zestril ; Toprol (resume Toprol and  Zestril PO - restart at lower doses)  Current BP meds: Cardene ; Labetalol  Stable . SBP goal < 140 mm Hg initially . Long-term BP goal normotensive  Hyperlipidemia  Home Lipid lowering medication: Zocor 40 mg daily  LDL - not ordered, goal < 70  Current lipid lowering medication: None (statin contraindicated with ICH)  Resume statin at discharge  Diabetes  Home diabetic meds: glucophage  Current diabetic meds: none - may need SSI  HgbA1c - 6.1, goal < 7.0 Recent Labs    02/21/19 1629 02/21/19 2236 02/22/19 0159  GLUCAP 188* 121* 118*    Other Stroke Risk Factors  Advanced age  Hx stroke/TIA - remote right basal ganglia lacunar infarct.   Atrial fibrillation (warfarin PTA)? Need to clarify why on Warfarin. Will hold Warfarin until bleed resolution. May not restart depending on reason.  Other Active Problems  Mild Leukocytosis - 8.9->11.3 ; temp - 99.4->98.1 ; U/A - pending - CXR 02/21/19 - Potential pneumonia and or effusion in the left lung base -> will start Unasyn per pharmacy dosing.  Warfarin (INR 2.0) -> vitamin K - Repeat Protime 12/5 ->1.4  CBC and Bmet pending   Hospital day # 1  I really lovely 85 year old female here with intracranial hemorrhage in the setting of warfarin use and hypertensive emergency. Patient reports she is on Warfarin because they saw "a clot in my heart" after her last stroke. Can consider restarting Warfarin when blood resolves however given her age need a discussion on whether to continue Warfarin after bleed. May have been due to hypertensive urgency, monitor blood pressure and if can control more aggressively may consider restarting on discharge.  Personally examined patient and images, and have participated in and made any corrections  needed to history, physical, neuro exam,assessment and plan as stated above.  I have personally obtained the history, evaluated lab date, reviewed imaging studies and agree with radiology interpretations.    Naomie Dean, MD Stroke Neurology   A total of 25 minutes was spent for the care of this patient, spent on counseling patient and family on different diagnostic and therapeutic options, counseling and coordination of care, riskd ans benefits of management, compliance, or risk factor reduction and education.  To contact Stroke Continuity provider, please refer to [email or web address]. After hours, contact General Neurology

## 2019-02-22 NOTE — Evaluation (Signed)
Speech Language Pathology Evaluation Patient Details Name: Kimberly Mullins MRN: [id number] DOB: [birth year] Today's Date: 02/22/2019 Time: 1040-1100 SLP Time Calculation (min) (ACUTE ONLY): 20 min  Problem List:  Patient Active Problem List   Diagnosis Date Noted  . ICH (intracerebral hemorrhage) (HCC) 02/21/2019   Past Medical History:  Past Medical History:  Diagnosis Date  . Diabetes mellitus without complication (HCC)   . Hypertension   . Stroke [facility])    Past Surgical History:  Past Surgical History:  Procedure Laterality Date  . APPENDECTOMY     HPI:  Pt is an 85 y/o female with a h/o DM, HTN, CVA who presents to the ED today for eval of weakness.  Patient states she is been feeling weak since yesterday.  She reports a history of left-sided deficits from prior CVA but today her symptoms seem to worsen the left side and she also has some weakness on the right side.  She denies any sensory changes.  Denies any difficulty with her speech other than some chronic intermittent aphasia.  Denies slurred speech or visual changes.  She is having a mild headache.  She denies any other symptoms including no recent fevers, chest pain, shortness of breath, cough, abdominal pain, nausea, vomiting, diarrhea or urinary symptoms.  CT of the head was showing a small hemorrhage in the genu of the corpus collosum bilaterally with mild localized edema but no mass effect.  Chest xray was showing potential PNA and/or effusion in left lung base.    Assessment / Plan / Recommendation Clinical Impression  Cognitive/linguistic and motor speech evaluation was completed.  Cranial nerve exam was completed and urnemarkable.  Lingual, labial, facial and jaw range of motion and strength were adequate.  Facial sensation appeared to be intact.  Her speech was clear and easy to understand. No discernible dysarthria or apraxia were noted. Her cognitve/lingustic skills appeared to be functional.  She achieved a score of  29/30 on the Mini Mental State Exam suggesting functional skills.  She was oriented x4 and had good attention to task.  Her immediate and delayed recall of 3 novel words was good.  Her language skills appeared to be intact.  She was able to name objects, follow a 3 step command, repeat a short sentence, read/comprehend a short sentence and write a sentence.  She was also able to provide logical solutions to simple problems.  Given results of evaluation ST follow up is not indicated.  If we can be of further assistance please feel free to re-consult.       SLP Assessment  SLP Recommendation/Assessment: Patient does not need any further Speech Lanaguage Pathology Services SLP Visit Diagnosis: Attention and concentration deficit Attention and concentration deficit following: Cerebral infarction    Follow Up Recommendations  None          SLP Evaluation Cognition  Arousal/Alertness: Awake/alert Orientation Level: Oriented X4 Attention: Sustained Sustained Attention: Appears intact Memory: Appears intact Awareness: Appears intact Problem Solving: Appears intact       Comprehension  Auditory Comprehension Overall Auditory Comprehension: Appears within functional limits for tasks assessed Commands: Within Functional Limits Conversation: Complex Reading Comprehension Reading Status: Within funtional limits    Expression Expression Primary Mode of Expression: Verbal Verbal Expression Overall Verbal Expression: Appears within functional limits for tasks assessed Initiation: No impairment Automatic Speech: Name;Social Response Level of Generative/Spontaneous Verbalization: Conversation Repetition: No impairment Naming: No impairment Pragmatics: No impairment Non-Verbal Means of Communication: Not applicable Written Expression Dominant Hand: Right Written  Expression: Within Functional Limits   Oral / Motor  Motor Speech Respiration: Within functional limits Phonation:  Normal Resonance: Within functional limits Articulation: Within functional limitis Intelligibility: Intelligible Motor Planning: Witnin functional limits Motor Speech Errors: Not applicable   GO                   Shelly Flatten, MA, CCC-SLP Acute Rehab SLP [phone]  Lamar Sprinkles 02/22/2019, 11:07 AM

## 2019-02-23 ENCOUNTER — Inpatient Hospital Stay (HOSPITAL_COMMUNITY): Payer: Medicare Other

## 2019-02-23 DIAGNOSIS — I6389 Other cerebral infarction: Secondary | ICD-10-CM | POA: Diagnosis not present

## 2019-02-23 LAB — ECHOCARDIOGRAM COMPLETE
Height: 63 in
Weight: 2423.3 oz

## 2019-02-23 LAB — CBC
HCT: 31 % — ABNORMAL LOW (ref 36.0–46.0)
Hemoglobin: 10 g/dL — ABNORMAL LOW (ref 12.0–15.0)
MCH: 29.5 pg (ref 26.0–34.0)
MCHC: 32.3 g/dL (ref 30.0–36.0)
MCV: 91.4 fL (ref 80.0–100.0)
Platelets: 165 10*3/uL (ref 150–400)
RBC: 3.39 MIL/uL — ABNORMAL LOW (ref 3.87–5.11)
RDW: 13.2 % (ref 11.5–15.5)
WBC: 9.7 10*3/uL (ref 4.0–10.5)
nRBC: 0 % (ref 0.0–0.2)

## 2019-02-23 LAB — GLUCOSE, CAPILLARY
Glucose-Capillary: 116 mg/dL — ABNORMAL HIGH (ref 70–99)
Glucose-Capillary: 128 mg/dL — ABNORMAL HIGH (ref 70–99)
Glucose-Capillary: 149 mg/dL — ABNORMAL HIGH (ref 70–99)
Glucose-Capillary: 120 mg/dL — ABNORMAL HIGH (ref 70–99)

## 2019-02-23 LAB — URINALYSIS, ROUTINE W REFLEX MICROSCOPIC
Bilirubin Urine: NEGATIVE
Glucose, UA: NEGATIVE mg/dL
Hgb urine dipstick: NEGATIVE
Ketones, ur: NEGATIVE mg/dL
Leukocytes,Ua: NEGATIVE
Nitrite: NEGATIVE
Protein, ur: 30 mg/dL — AB
Specific Gravity, Urine: 1.025 (ref 1.005–1.030)
pH: 5 (ref 5.0–8.0)

## 2019-02-23 LAB — RAPID URINE DRUG SCREEN, HOSP PERFORMED
Amphetamines: NOT DETECTED
Barbiturates: NOT DETECTED
Benzodiazepines: NOT DETECTED
Cocaine: NOT DETECTED
Opiates: NOT DETECTED
Tetrahydrocannabinol: NOT DETECTED

## 2019-02-23 LAB — BASIC METABOLIC PANEL
Anion gap: 10 (ref 5–15)
BUN: 14 mg/dL (ref 8–23)
CO2: 24 mmol/L (ref 22–32)
Calcium: 8.8 mg/dL — ABNORMAL LOW (ref 8.9–10.3)
Chloride: 107 mmol/L (ref 98–111)
Creatinine, Ser: 0.78 mg/dL (ref 0.44–1.00)
GFR calc Af Amer: >60 mL/min (ref >60)
GFR calc non Af Amer: >60 mL/min (ref >60)
Glucose, Bld: 123 mg/dL — ABNORMAL HIGH (ref 70–99)
Potassium: 3.2 mmol/L — ABNORMAL LOW (ref 3.5–5.1)
Sodium: 141 mmol/L (ref 135–145)

## 2019-02-23 MED ORDER — POTASSIUM CHLORIDE CRYS ER 20 MEQ PO TBCR
40.0000 meq | EXTENDED_RELEASE_TABLET | Freq: Two times a day (BID) | ORAL | Status: AC
  Administered 2019-02-23 (×2): 40 meq via ORAL
  Filled 2019-02-23 (×2): qty 2

## 2019-02-23 MED ORDER — POTASSIUM CHLORIDE 10 MEQ/100ML IV SOLN: 10.0000 meq | INTRAVENOUS | Status: DC

## 2019-02-23 NOTE — TOC Initial Note (Signed)
Transition of Care [facility]) - Initial/Assessment Note    Patient Details  Name: Kimberly Mullins MRN: [id number] Date of Birth: 1931/09/20  Transition of [facility]) CM/SW Contact:    Carles Collet, RN Phone Number: 02/23/2019, 3:52 PM  Clinical Narrative:                 Damaris Schooner w patient's son. He states that both patient and he are agreeable to SNF. Explained process. He would like SNFs in area to review and choice be made by him, his mom, and his sister who is local after bed offers have come back. PASRR number obtained. FL2 assessment done. Patient sent out over [place]   Expected Discharge Plan: Skilled Nursing Facility Barriers to Discharge: Continued Medical Work up   Patient Goals and CMS Choice Patient states their goals for this hospitalization and ongoing recovery are:: to go to SNF      Expected Discharge Plan and Services Expected Discharge Plan: [place]   Discharge Planning Services: CM Consult Post Acute Care Choice: [place] Living arrangements for the past 2 months: [place]                                      Prior Living Arrangements/Services Living arrangements for the past 2 months: [place] Lives with:: Self Patient language and need for interpreter reviewed:: Yes        Need for Family Participation in Patient Care: Yes (Comment) Care giver support system in place?: Yes (comment)   Criminal Activity/Legal Involvement Pertinent to Current Situation/Hospitalization: No - Comment as needed  Activities of Daily Living Home Assistive Devices/Equipment: Gilford Rile (specify type) ADL Screening (condition at time of admission) Patient's cognitive ability adequate to safely complete daily activities?: Yes Is the patient deaf or have difficulty hearing?: No Does the patient have difficulty seeing, even when wearing glasses/contacts?: No Does the patient have difficulty concentrating,  remembering, or making decisions?: No Patient able to express need for assistance with ADLs?: Yes Does the patient have difficulty dressing or bathing?: No Independently performs ADLs?: No Communication: Independent Dressing (OT): Needs assistance Is this a change from baseline?: Change from baseline, expected to last <3days Grooming: Needs assistance Is this a change from baseline?: Change from baseline, expected to last <3 days Feeding: Needs assistance Is this a change from baseline?: Change from baseline, expected to last <3 days Bathing: Needs assistance Is this a change from baseline?: Change from baseline, expected to last <3 days Toileting: Needs assistance Is this a change from baseline?: Change from baseline, expected to last <3 days In/Out Bed: Needs assistance Is this a change from baseline?: Change from baseline, expected to last <3 days Walks in Home: Needs assistance Is this a change from baseline?: Change from baseline, expected to last <3 days Does the patient have difficulty walking or climbing stairs?: No Weakness of Legs: Both Weakness of Arms/Hands: None  Permission Sought/Granted   Permission granted to share information with : Yes, Verbal Permission Granted  Share Information with NAME: Ersa, Delaney   [phone]           Emotional Assessment           Psych Involvement: No (comment)  Admission diagnosis:  Nontraumatic intracerebral hemorrhage, unspecified cerebral location, unspecified laterality [facility]) [I61.9] Patient Active Problem List   Diagnosis Date Noted  . ICH (intracerebral hemorrhage) ([place]) 02/21/2019  PCP:  Salli Real, MD Pharmacy:   [facility] [address] [place], [place] - [address] [place] [address] Phone: [phone] Fax: [phone]     Social Determinants of Health (SDOH) Interventions    Readmission Risk Interventions No flowsheet data found.

## 2019-02-23 NOTE — [place] FL2 (Addendum)
[place] MEDICAID FL2 LEVEL OF CARE SCREENING TOOL     IDENTIFICATION  Patient Name: Kimberly Mullins Birthdate: [birth year] Sex: female Admission Date (Current Location): 02/20/2019  [facility] and [place] Number:  Producer, television/film/video and Address:  [facility], [address], [place], [place] [address]      Provider Number: [id number]  Attending Physician Name and Address:  Micki Riley, MD  Relative Name and Phone Number:       Current Level of Care: Hospital Recommended Level of Care: Skilled Nursing Facility Prior Approval Number:    Date Approved/Denied: 02/23/19 PASRR Number: [id number]  Discharge Plan: SNF    Current Diagnoses: Patient Active Problem List   Diagnosis Date Noted   ICH (intracerebral hemorrhage) (HCC) 02/21/2019    Orientation RESPIRATION BLADDER Height & Weight     Self, Time, Situation, Place  Normal Incontinent Weight: 68.7 kg Height:  5\' 3"  (160 cm)  BEHAVIORAL SYMPTOMS/MOOD NEUROLOGICAL BOWEL NUTRITION STATUS      Continent Diet(see DC summary)  AMBULATORY STATUS COMMUNICATION OF NEEDS Skin   Limited Assist Verbally Bruising                       Personal Care Assistance Level of Assistance  Bathing, Dressing, Feeding Bathing Assistance: Limited assistance Feeding assistance: Limited assistance Dressing Assistance: Limited assistance     Functional Limitations Info  Sight, Hearing Sight Info: Adequate Hearing Info: Adequate      SPECIAL CARE FACTORS FREQUENCY  PT (By licensed PT), OT (By licensed OT)     PT Frequency: 5 times a week OT Frequency: 5 times a week            Contractures Contractures Info: Not present    Additional Factors Info  Code Status, Allergies Code Status Info: Full Allergies Info: Erythromycin, see DC summary for complete list of allergies           Current Medications (02/23/2019):  This is the current hospital active medication list Current  Facility-Administered Medications  Medication Dose Route Frequency Provider Last Rate Last Dose   acetaminophen (TYLENOL) tablet 650 mg  650 mg Oral Q4H PRN Aroor, 14/09/2018, MD   650 mg at 02/23/19 1457   Or   acetaminophen (TYLENOL) 160 MG/5ML solution 650 mg  650 mg Per Tube Q4H PRN Aroor, 14/07/20, MD       Or   acetaminophen (TYLENOL) suppository 650 mg  650 mg Rectal Q4H PRN Aroor, Dara Lords R, MD       Ampicillin-Sulbactam (UNASYN) 3 g in sodium chloride 0.9 % 100 mL IVPB  3 g Intravenous Q8H Georgiana Spinner, MD 200 mL/hr at 02/23/19 1409 3 g at 02/23/19 1409   Chlorhexidine Gluconate Cloth 2 % PADS 6 each  6 each Topical Daily Aroor, Sushanth R, MD   6 each at 02/23/19 1400   insulin aspart (novoLOG) injection 0-9 Units  0-9 Units Subcutaneous TID WC 14/07/20 B, MD   1 Units at 02/23/19 1222   labetalol (NORMODYNE) injection 20 mg  20 mg Intravenous Q4H PRN 14/07/20 B, MD   20 mg at 02/23/19 0426   lisinopril (ZESTRIL) tablet 30 mg  30 mg Oral Daily Rinehuls, David L, PA-C   30 mg at 02/23/19 0905   metoprolol succinate (TOPROL-XL) 24 hr tablet 50 mg  50 mg Oral Daily Rinehuls, David L, PA-C   50 mg at 02/23/19 0905   pantoprazole (PROTONIX)  EC tablet 40 mg  40 mg Oral QHS Garvin Fila, MD   40 mg at 02/22/19 2126   potassium chloride SA (KLOR-CON) CR tablet 40 mEq  40 mEq Oral BID Donzetta Starch, NP   40 mEq at 02/23/19 1411   senna-docusate (Senokot-S) tablet 1 tablet  1 tablet Oral BID Aroor, Lanice Schwab, MD   1 tablet at 02/23/19 9407     Discharge Medications: Please see discharge summary for a list of discharge medications.  Relevant Imaging Results:  Relevant Lab Results:   Additional Information [id number]  Carles Collet, RN   I have personally obtained history,examined this patient, reviewed notes, independently viewed imaging studies, participated in medical decision making and plan of care.ROS completed by me personally and pertinent positives fully  documented  I have made any additions or clarifications directly to the above note. Agree with note above.    Antony Contras, MD Medical Director [facility] Pager: [phone] 02/27/2019 1:11 PM

## 2019-02-23 NOTE — Progress Notes (Signed)
Physical Therapy Treatment Patient Details Name: Kimberly Mullins MRN: [id number] DOB: Mar 06, 1932 Today's Date: 02/23/2019    History of Present Illness Kimberly Mullins is a 85 y.o. female with past medical history significant for hypertension, diabetes mellitus, stroke with mild left-sided residual deficits presents to the emergency department with generalized weakness and difficulty walking since yesterday night. Head CT which revealed a small hemorrhage in the genu of the corpus callosum bilaterally with mild localized edema but no mass-effect or intraventricular hemorrhage.      PT Comments    Pt progressing well. Pt more alert and participatory in therapy today. Pt able to amb 30' with RW and modAx2 (for chair follow). Pt con't to require assist for all ADLs and mobility. Spoke with son and pt regarding going to SNF upon d/c prior to returning to [place]. They both agreed. Acute PT to cont to follow.   Follow Up Recommendations  SNF;Supervision/Assistance - 24 hour     Equipment Recommendations  Wheelchair (measurements PT);3in1 (PT)    Recommendations for Other Services       Precautions / Restrictions Precautions Precautions: Fall Restrictions Weight Bearing Restrictions: No    Mobility  Bed Mobility Overal bed mobility: Needs Assistance Bed Mobility: Rolling;Sidelying to Sit Rolling: Max assist Sidelying to sit: Max assist       General bed mobility comments: max directional verbal cues, maxA for trunk elevation, pt able to scoot to the EOB, pt c/o back pain  Transfers Overall transfer level: Needs assistance Equipment used: Rolling walker (2 wheeled) Transfers: Sit to/from Stand Sit to Stand: Mod assist;+2 safety/equipment         General transfer comment: pt powered up, modA to steady as bed height is high and pt is short, modA to put weight over her feet  Ambulation/Gait Ambulation/Gait assistance: Mod assist;+2 safety/equipment Gait Distance (Feet): 30  Feet Assistive device: Rolling walker (2 wheeled) Gait Pattern/deviations: Step-through pattern;Decreased stride length;Trunk flexed Gait velocity: slow Gait velocity interpretation: <1.31 ft/sec, indicative of household ambulator General Gait Details: pt extremely kyphotic and unable to stand up right, pt with short steps but able to clear feet, pt with 3 standing rest breaks   Stairs             Wheelchair Mobility    Modified Rankin (Stroke Patients Only) Modified Rankin (Stroke Patients Only) Pre-Morbid Rankin Score: Moderate disability Modified Rankin: Moderately severe disability     Balance Overall balance assessment: Needs assistance Sitting-balance support: Feet unsupported;Bilateral upper extremity supported Sitting balance-Leahy Scale: Poor Sitting balance - Comments: reliant on bilateral hand support   Standing balance support: Bilateral upper extremity supported Standing balance-Leahy Scale: Poor Standing balance comment: requires modA for initial standing balance and then pt able to take steps                            Cognition Arousal/Alertness: Awake/alert Behavior During Therapy: WFL for tasks assessed/performed Overall Cognitive Status: Within Functional Limits for tasks assessed                                 General Comments: pt more alert today and appropriate and interactive      Exercises      General Comments General comments (skin integrity, edema, etc.): Pt progressing well      Pertinent Vitals/Pain Pain Assessment: Faces Faces Pain Scale: Hurts little more Pain Location:  back Pain Descriptors / Indicators: Grimacing Pain Intervention(s): Limited activity within patient's tolerance    Home Living                      Prior Function            PT Goals (current goals can now be found in the care plan section) Acute Rehab PT Goals Patient Stated Goal: to go home Progress towards PT goals:  Progressing toward goals    Frequency    Min 3X/week      PT Plan Current plan remains appropriate    Co-evaluation              AM-PAC PT "6 Clicks" Mobility   Outcome Measure  Help needed turning from your back to your side while in a flat bed without using bedrails?: A Lot Help needed moving from lying on your back to sitting on the side of a flat bed without using bedrails?: A Lot Help needed moving to and from a bed to a chair (including a wheelchair)?: A Lot Help needed standing up from a chair using your arms (e.g., wheelchair or bedside chair)?: A Lot Help needed to walk in hospital room?: A Lot Help needed climbing 3-5 steps with a railing? : Total 6 Click Score: 11    End of Session Equipment Utilized During Treatment: Gait belt Activity Tolerance: Patient tolerated treatment well Patient left: in chair;with call bell/phone within reach;with chair alarm set;with family/visitor present Nurse Communication: Mobility status PT Visit Diagnosis: Other abnormalities of gait and mobility (R26.89);Muscle weakness (generalized) (M62.81);Difficulty in walking, not elsewhere classified (R26.2);Other symptoms and signs involving the nervous system (R29.898)     Time: 3007-6226 PT Time Calculation (min) (ACUTE ONLY): 28 min  Charges:  $Gait Training: 8-22 mins $Therapeutic Activity: 8-22 mins                     Lewis Shock, PT, DPT Acute Rehabilitation Services Pager #: [phone] Office #: [phone]    [name] 02/23/2019, 2:18 PM

## 2019-02-23 NOTE — Progress Notes (Signed)
  Echocardiogram 2D Echocardiogram has been performed.  Jennette Dubin 02/23/2019, 2:22 PM

## 2019-02-23 NOTE — Progress Notes (Signed)
STROKE TEAM PROGRESS NOTE   INTERVAL HISTORY Patient is sitting up in bed comfortably.  Blood pressure adequately controlled.  She has no complaints.  She states she was started on warfarin 1992 when she had a stroke and was told she has a clot in the back of her head.  She does not remember if she was told whether she had atrial fibrillation but may have had rheumatic heart disease.  OBJECTIVE Vitals:   02/23/19 0400 02/23/19 0500 02/23/19 0600 02/23/19 0800  BP: (!) 175/82 (!) 151/60 (!) 112/43   Pulse: 91 80 73   Resp: (!) 21 18 20    Temp: 98.7 F (37.1 C)   99.9 F (37.7 C)  TempSrc: Axillary   Axillary  SpO2: 98% 97% 95%   Weight:      Height:        CBC:  Recent Labs  Lab 02/21/19 0333 02/22/19 0657 02/23/19 0633  WBC 11.3* 9.5 9.7  NEUTROABS 7.3  --   --   HGB 12.4 10.2* 10.0*  HCT 37.6 30.9* 31.0*  MCV 89.5 89.3 91.4  PLT 231 174 165    Basic Metabolic Panel:  Recent Labs  Lab 02/22/19 0657 02/23/19 0633  NA 136 141  K 3.4* 3.2*  CL 105 107  CO2 21* 24  GLUCOSE 116* 123*  BUN 22 14  CREATININE 0.76 0.78  CALCIUM 8.6* 8.8*    Lipid Panel: No results found for: CHOL, TRIG, HDL, CHOLHDL, VLDL, LDLCALC HgbA1c:  Lab Results  Component Value Date   HGBA1C 6.1 (H) 02/21/2019   Urine Drug Screen: No results found for: LABOPIA, COCAINSCRNUR, LABBENZ, AMPHETMU, THCU, LABBARB  Alcohol Level     Component Value Date/Time   ETH <10 02/21/2019 0558    IMAGING  Ct Head Wo Contrast 02/21/2019 1. Focal parenchymal hemorrhage involving the anterior genu of the corpus callosum bilaterally, left greater than right, with posterior extension along the body of the left corpus callosum. Mild localized edema without significant regional mass effect. Finding of uncertain etiology, with primary differential considerations including an acute hemorrhagic infarct, atypical hypertensive hemorrhage, or coagulopathic bleed. No visible underlying mass lesion or vascular  malformation. No other findings to suggest acute traumatic injury.  2. Moderately advanced age-related cerebral atrophy with chronic small vessel ischemic disease and remote right basal ganglia lacunar infarct.   Portable Chest - 1 View 02/21/2019 Potential pneumonia and or effusion in the left lung base.  ECG - SR rate 91 BPM. (See cardiology reading for complete details)   PHYSICAL EXAM  Pleasant frail elderly Caucasian lady not in distress. . Afebrile. Head is nontraumatic. Neck is supple without bruit.    Cardiac exam no murmur or gallop. Lungs are clear to auscultation. Distal pulses are well felt. Neurological Exam : Awake alert oriented x2.  Diminished attention, recall.  No aphasia apraxia or dysarthria.  Extraocular movements are full range without nystagmus.  Blinks to threat bilaterally.  Motor system exam revealed no upper or lower extremity drift with symmetric equal strength in all 4 extremities except for mild weakness of left grip and intrinsic hand muscles.  Orbits right over left upper extremity.  Mild weakness of left hip flexors and ankle dorsiflexors only..  Plantars are downgoing.  Gait not tested. ASSESSMENT/PLAN Kimberly Mullins is a lovely 85 y.o. female with history of hypertension, diabetes mellitus, previous stroke with mild left-sided residual deficits, and Coumadin PTA (INR 2.0) presents to the emergency department with generalized weakness and difficulty  walking. She did not receive IV t-PA due to hemorrhage.  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  [employer] Virus 2 - negative  LDL - pending    HgbA1c - 6.1  UDS - pending    VTE prophylaxis - SCDs  warfarin daily prior to  admission, now on No antithrombotic given hemorrhage    Therapy recommendations:  SNF  Disposition:  Pending  Transfer to the floor  Chronic Warfarin use - why?  Warfarin (INR 2.0) -> vitamin K - Repeat Protime 12/5 ->1.4    Pt reports hx cardiac thrombus 01/19/1991 admission to [facility]  Only other documentation since that time is from warfarin clinic at [facility] where they continued warfarin in setting of prior stroke.  Have requested records from [facility] to see if we can review that admission  Not a candidate for [facility] now, likely can resume in 1 month but unsure that is necessary  Hypertensive Emergency  BP 181/102 on arrival to ED  Home BP meds: Zestril 20; Toprol 50 XL daily  Current BP meds: Zestril 30, Toprol 50 XL daily  Was on cardene, now off  BP Stable . SBP goal < 140 mm Hg   . Long-term BP goal normotensive  Hyperlipidemia  Home Lipid lowering medication: Zocor 40 mg daily  LDL - not ordered, goal < 70  Current lipid lowering medication: None (statin contraindicated with ICH)  Resume statin at discharge  Diabetes type II  Home diabetic meds: glucophage  Current diabetic meds: SSI  HgbA1c - 6.1, goal < 7.0 Recent Labs    02/22/19 1651 02/22/19 2120 02/23/19 0751  GLUCAP 113* 131* 116*   Other Stroke Risk Factors  Advanced age  Hx stroke/TIA -  remote right basal ganglia lacunar infarct (no details available in Epic)  Other Active Problems  Mild Leukocytosis, resolved - 9.7 ; TMax - 100.9 ; U/A -  pending - CXR 02/21/19 - Potential pneumonia and or effusion in the left lung base -> Unasyn 12/6>>  Anemia 10.0   Hypokalemia 3.4->3.2- supplement - recheck in am   Hospital day # 2 Continue strict blood pressure control.  Mobilize out of bed.  Therapy consults.  Will hold anticoagulation for now but will need to discuss with family consideration for resuming warfarin versus aspirin particularly if there is no definite  documented atrial fibrillation.  Check echocardiogram results. This patient is critically ill and at significant risk of neurological worsening, death and care requires constant monitoring of vital signs, hemodynamics,respiratory and cardiac monitoring, extensive review of multiple databases, frequent neurological assessment, discussion with family, other specialists and medical decision making of high complexity.I have made any additions or clarifications directly to the above note.This critical care time does not reflect procedure time, or teaching time or supervisory time of PA/NP/Med Resident etc but could involve care discussion time.  I spent 30 minutes of neurocritical care time  in the care of  this patient.    Antony Contras, MD To contact Stroke Continuity provider, please refer to [email or web address] After hours, contact General Neurology

## 2019-02-24 ENCOUNTER — Inpatient Hospital Stay (HOSPITAL_COMMUNITY): Payer: Medicare Other

## 2019-02-24 LAB — CBC
HCT: 33.5 % — ABNORMAL LOW (ref 36.0–46.0)
Hemoglobin: 10.9 g/dL — ABNORMAL LOW (ref 12.0–15.0)
MCH: 29.5 pg (ref 26.0–34.0)
MCHC: 32.5 g/dL (ref 30.0–36.0)
MCV: 90.8 fL (ref 80.0–100.0)
Platelets: 185 10*3/uL (ref 150–400)
RBC: 3.69 MIL/uL — ABNORMAL LOW (ref 3.87–5.11)
RDW: 13.2 % (ref 11.5–15.5)
WBC: 10 10*3/uL (ref 4.0–10.5)
nRBC: 0 % (ref 0.0–0.2)

## 2019-02-24 LAB — BASIC METABOLIC PANEL
Anion gap: 11 (ref 5–15)
BUN: 12 mg/dL (ref 8–23)
CO2: 24 mmol/L (ref 22–32)
Calcium: 9.3 mg/dL (ref 8.9–10.3)
Chloride: 106 mmol/L (ref 98–111)
Creatinine, Ser: 0.68 mg/dL (ref 0.44–1.00)
GFR calc Af Amer: >60 mL/min (ref >60)
GFR calc non Af Amer: >60 mL/min (ref >60)
Glucose, Bld: 104 mg/dL — ABNORMAL HIGH (ref 70–99)
Potassium: 4.4 mmol/L (ref 3.5–5.1)
Sodium: 141 mmol/L (ref 135–145)

## 2019-02-24 LAB — GLUCOSE, CAPILLARY
Glucose-Capillary: 92 mg/dL (ref 70–99)
Glucose-Capillary: 112 mg/dL — ABNORMAL HIGH (ref 70–99)
Glucose-Capillary: 106 mg/dL — ABNORMAL HIGH (ref 70–99)
Glucose-Capillary: 112 mg/dL — ABNORMAL HIGH (ref 70–99)

## 2019-02-24 LAB — LIPID PANEL
Cholesterol: 155 mg/dL (ref 0–200)
HDL: 60 mg/dL (ref >40)
LDL Cholesterol: 78 mg/dL (ref 0–99)
Total CHOL/HDL Ratio: 2.6 RATIO
Triglycerides: 86 mg/dL (ref <150)
VLDL: 17 mg/dL (ref 0–40)

## 2019-02-24 MED ORDER — OMEGA-3-ACID ETHYL ESTERS 1 G PO CAPS
1000.0000 mg | ORAL_CAPSULE | Freq: Every day | ORAL | Status: DC
  Administered 2019-02-24 – 2019-02-27 (×4): 1000 mg via ORAL
  Filled 2019-02-24 (×4): qty 1

## 2019-02-24 MED ORDER — CALCIUM CARBONATE ANTACID 500 MG PO CHEW
1.0000 | CHEWABLE_TABLET | Freq: Three times a day (TID) | ORAL | Status: DC | PRN
  Administered 2019-02-24 – 2019-02-25 (×3): 200 mg via ORAL
  Filled 2019-02-24 (×4): qty 1

## 2019-02-24 MED ORDER — OXYBUTYNIN CHLORIDE 5 MG PO TABS
5.0000 mg | ORAL_TABLET | Freq: Two times a day (BID) | ORAL | Status: DC
  Administered 2019-02-24 – 2019-02-27 (×7): 5 mg via ORAL
  Filled 2019-02-24 (×8): qty 1

## 2019-02-24 MED ORDER — SODIUM CHLORIDE 0.9 % IV SOLN
INTRAVENOUS | Status: DC | PRN
  Administered 2019-02-24: 22:00:00 250 mL via INTRAVENOUS

## 2019-02-24 MED ORDER — VITAMIN D 25 MCG (1000 UNIT) PO TABS
1000.0000 [IU] | ORAL_TABLET | Freq: Two times a day (BID) | ORAL | Status: DC
  Administered 2019-02-24 – 2019-02-27 (×7): 1000 [IU] via ORAL
  Filled 2019-02-24 (×9): qty 1

## 2019-02-24 MED ORDER — VITAMIN C 500 MG PO TABS
250.0000 mg | ORAL_TABLET | Freq: Two times a day (BID) | ORAL | Status: DC
  Administered 2019-02-24 – 2019-02-27 (×7): 250 mg via ORAL
  Filled 2019-02-24 (×7): qty 1

## 2019-02-24 MED ORDER — LORATADINE 10 MG PO TABS
10.0000 mg | ORAL_TABLET | Freq: Every day | ORAL | Status: DC
  Administered 2019-02-24 – 2019-02-27 (×4): 10 mg via ORAL
  Filled 2019-02-24 (×4): qty 1

## 2019-02-24 NOTE — Progress Notes (Signed)
Physical Therapy Treatment Patient Details Name: Kimberly Mullins MRN: [id number] DOB: May 09, 1931 Today's Date: 02/24/2019    History of Present Illness Kimberly Mullins is a 85 y.o. female with past medical history significant for hypertension, diabetes mellitus, stroke with mild left-sided residual deficits presents to the emergency department with generalized weakness and difficulty walking since yesterday night. Head CT which revealed a small hemorrhage in the genu of the corpus callosum bilaterally with mild localized edema but no mass-effect or intraventricular hemorrhage.      PT Comments    Pt with good tolerance for OOB activity today, ambulating to and from sink with mod assist +2 with seated rest break at halfway point. Pt with 1 LOB noted during session, corrected by PT and OT. Pt with tendency for posterior leaning in standing, pt states she is aware but has difficulty correcting this. PT to continue to recommend SNF level of care, family and pt appear to be in agreement. PT to continue to follow acutely. VSS during session.     Follow Up Recommendations  SNF;Supervision/Assistance - 24 hour     Equipment Recommendations  Wheelchair (measurements PT);3in1 (PT)    Recommendations for Other Services       Precautions / Restrictions Precautions Precautions: Fall Restrictions Weight Bearing Restrictions: No    Mobility  Bed Mobility Overal bed mobility: Needs Assistance             General bed mobility comments: seated in recliner, requesting back to recliner post-PT.  Transfers Overall transfer level: Needs assistance Equipment used: Rolling walker (2 wheeled) Transfers: Sit to/from Stand Sit to Stand: Mod assist;From elevated surface;+2 safety/equipment         General transfer comment: mod assist +2 for power up, steadying. Pt with heavy posterior leaning upon initial standing, able to correct with PT assist and pt anterior leaning. One posterior LOB,  recovered by PT. sit to stand x2, once from recliner and once from [facility] in front of sink prior to initiation of grooming with OT.  Ambulation/Gait Ambulation/Gait assistance: Mod assist;+2 safety/equipment Gait Distance (Feet): 15 Feet(2x15 ft, to and from sink for grooming) Assistive device: Rolling walker (2 wheeled) Gait Pattern/deviations: Step-through pattern;Decreased stride length;Trunk flexed;Drifts right/left Gait velocity: decr   General Gait Details: Mod assist +2 for lines/leads and chair follow, assist for steadying and guiding pt/RW. Verbal cuing for looking forwads, although pt limited by kyphotic posture and forward head, placement in RW, and room navigation.   Stairs             Wheelchair Mobility    Modified Rankin (Stroke Patients Only)       Balance Overall balance assessment: Needs assistance Sitting-balance support: Bilateral upper extremity supported;Feet supported Sitting balance-Leahy Scale: Poor Sitting balance - Comments: reliant on bilateral hand support   Standing balance support: Bilateral upper extremity supported;Single extremity supported Standing balance-Leahy Scale: Poor Standing balance comment: reliant on RW, posterior leaning in standing with LOB noted. Single UE balance x2 minutes while fixing hair                            Cognition Arousal/Alertness: Awake/alert Behavior During Therapy: WFL for tasks assessed/performed Overall Cognitive Status: Within Functional Limits for tasks assessed  Exercises      General Comments General comments (skin integrity, edema, etc.): SpO2 92% and greater on RA (when pleth and reading accurate)      Pertinent Vitals/Pain Pain Assessment: Faces Faces Pain Scale: Hurts little more Pain Location: back Pain Descriptors / Indicators: Grimacing;Discomfort Pain Intervention(s): Limited activity within patient's  tolerance;Monitored during session;Repositioned    Home Living                      Prior Function            PT Goals (current goals can now be found in the care plan section) Acute Rehab PT Goals Patient Stated Goal: to go home PT Goal Formulation: With patient Time For Goal Achievement: 03/07/19 Potential to Achieve Goals: Fair Progress towards PT goals: Progressing toward goals    Frequency    Min 3X/week      PT Plan Other (comment)(unable to locate post-session)    Co-evaluation PT/OT/SLP Co-Evaluation/Treatment: Yes Reason for Co-Treatment: For patient/therapist safety;To address functional/ADL transfers PT goals addressed during session: Mobility/safety with mobility;Balance OT goals addressed during session: ADL's and self-care      AM-PAC PT "6 Clicks" Mobility   Outcome Measure  Help needed turning from your back to your side while in a flat bed without using bedrails?: A Lot Help needed moving from lying on your back to sitting on the side of a flat bed without using bedrails?: A Lot Help needed moving to and from a bed to a chair (including a wheelchair)?: A Lot Help needed standing up from a chair using your arms (e.g., wheelchair or bedside chair)?: A Lot Help needed to walk in hospital room?: A Lot Help needed climbing 3-5 steps with a railing? : Total 6 Click Score: 11    End of Session Equipment Utilized During Treatment: Gait belt Activity Tolerance: Patient tolerated treatment well Patient left: in chair;with call bell/phone within reach;with chair alarm set;with family/visitor present Nurse Communication: Mobility status PT Visit Diagnosis: Other abnormalities of gait and mobility (R26.89);Muscle weakness (generalized) (M62.81);Difficulty in walking, not elsewhere classified (R26.2);Other symptoms and signs involving the nervous system (F09.323)     Time: 5573-2202 PT Time Calculation (min) (ACUTE ONLY): 28 min  Charges:  $Gait  Training: 8-22 mins                    Amen Dargis E, PT Acute Rehabilitation Services Pager [phone]  Office [phone]   Shantea Poulton D Despina Hidden 02/24/2019, 4:40 PM

## 2019-02-24 NOTE — Progress Notes (Signed)
STROKE TEAM PROGRESS NOTE   INTERVAL HISTORY Patient is lying in bed.  No changes.  Blood pressure tightly controlled.  No new complaints the patient and family at mention possibility of rheumatic heart disease in the patient but 2D echo shows normal ejection fraction and moderate mitral stenosis with mild residual dilatation and severe mitral annular calcification with moderate mitral valve thickening.  OBJECTIVE Vitals:   02/24/19 0500 02/24/19 0600 02/24/19 0700 02/24/19 0800  BP: (!) 144/107 (!) 168/68    Pulse: 93 83 95   Resp: (!) 22 (!) 22 (!) 27   Temp:    100.3 F (37.9 C)  TempSrc:    Oral  SpO2: 100% 97% 96%   Weight:      Height:        CBC:  Recent Labs  Lab 02/21/19 0333  02/23/19 0633 02/24/19 0506  WBC 11.3*   < > 9.7 10.0  NEUTROABS 7.3  --   --   --   HGB 12.4   < > 10.0* 10.9*  HCT 37.6   < > 31.0* 33.5*  MCV 89.5   < > 91.4 90.8  PLT 231   < > 165 185   < > = values in this interval not displayed.    Basic Metabolic Panel:  Recent Labs  Lab 02/23/19 0633 02/24/19 0506  NA 141 141  K 3.2* 4.4  CL 107 106  CO2 24 24  GLUCOSE 123* 104*  BUN 14 12  CREATININE 0.78 0.68  CALCIUM 8.8* 9.3    Lipid Panel:     Component Value Date/Time   CHOL 155 02/24/2019 0506   TRIG 86 02/24/2019 0506   HDL 60 02/24/2019 0506   CHOLHDL 2.6 02/24/2019 0506   VLDL 17 02/24/2019 0506   LDLCALC 78 02/24/2019 0506   HgbA1c:  Lab Results  Component Value Date   HGBA1C 6.1 (H) 02/21/2019   Urine Drug Screen:     Component Value Date/Time   LABOPIA NONE DETECTED 02/23/2019 1413   COCAINSCRNUR NONE DETECTED 02/23/2019 1413   LABBENZ NONE DETECTED 02/23/2019 1413   AMPHETMU NONE DETECTED 02/23/2019 1413   THCU NONE DETECTED 02/23/2019 1413   LABBARB NONE DETECTED 02/23/2019 1413    Alcohol Level     Component Value Date/Time   ETH <10 02/21/2019 0558    IMAGING  Ct Head Wo Contrast 02/21/2019 1. Focal parenchymal hemorrhage involving the anterior  genu of the corpus callosum bilaterally, left greater than right, with posterior extension along the body of the left corpus callosum. Mild localized edema without significant regional mass effect. Finding of uncertain etiology, with primary differential considerations including an acute hemorrhagic infarct, atypical hypertensive hemorrhage, or coagulopathic bleed. No visible underlying mass lesion or vascular malformation. No other findings to suggest acute traumatic injury.  2. Moderately advanced age-related cerebral atrophy with chronic small vessel ischemic disease and remote right basal ganglia lacunar infarct.   Portable Chest - 1 View 02/21/2019 Potential pneumonia and or effusion in the left lung base.  ECG - SR rate 91 BPM. (See cardiology reading for complete details)   PHYSICAL EXAM   Pleasant frail elderly Caucasian lady not in distress. . Afebrile. Head is nontraumatic. Neck is supple without bruit.    Cardiac exam no murmur or gallop. Lungs are clear to auscultation. Distal pulses are well felt. Neurological Exam : Awake alert oriented x2.  Diminished attention, recall.  No aphasia apraxia or dysarthria.  Extraocular movements are full range without  nystagmus.  Blinks to threat bilaterally.  Motor system exam revealed no upper or lower extremity drift with symmetric equal strength in all 4 extremities except for mild weakness of left grip and intrinsic hand muscles.  Orbits right over left upper extremity.  Mild weakness of left hip flexors and ankle dorsiflexors only..  Plantars are downgoing.  Gait not tested.   ASSESSMENT/PLAN Kimberly Mullins is a lovely 85 y.o. female with history of hypertension, diabetes mellitus, previous stroke with mild left-sided residual deficits, and Coumadin PTA (INR 2.0) presents to the emergency department with generalized weakness and difficulty walking. She did not receive IV t-PA due to hemorrhage.  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  Sars Corona Virus 2 - negative  LDL - 78   HgbA1c - 6.1  UDS - negative  VTE prophylaxis - SCDs  warfarin daily prior to admission, now on No antithrombotic given hemorrhage    Therapy recommendations:  SNF  Disposition:  Pending  Transferred to the floor 12/7 - awaiting bed  Chronic Warfarin use - why?  Warfarin (INR 2.0) -> vitamin K - Repeat Protime 12/5 ->1.4    Pt reports hx cardiac thrombus 01/19/1991 admission to [facility]  Only other documentation since that time is from warfarin clinic at [facility] where they continued warfarin in setting of prior stroke.  Have requested records from [facility] to see if we can review that admission  Not a candidate for [facility] now, likely can resume in 1 month but unsure that is necessary  Hypertensive Emergency  BP 181/102 on arrival to ED  Home BP meds: Zestril 20; Toprol 50 XL daily  Current BP meds: Zestril 30, Toprol 50 XL daily  Was on cardene, now off  BP Stable 140-150s . SBP goal < 160 mm Hg   . Long-term BP goal normotensive  Hyperlipidemia  Home Lipid lowering medication: Zocor 40 mg daily  LDL - 78, goal < 70  Current lipid lowering medication: None (statin contraindicated with ICH)  Resume statin at discharge  Diabetes type II  Home diabetic meds: glucophage  Current diabetic meds: SSI  HgbA1c - 6.1, goal < 7.0 Recent Labs    02/23/19 1712 02/23/19 2110 02/24/19 0759  GLUCAP 149* 120* 92   Other Stroke Risk Factors  Advanced age  Hx stroke/TIA -  remote right basal ganglia lacunar infarct (no details available in Epic)  Other Active Problems  Possible aspiration PNA. Mild Leukocytosis, resolved - 10.0;  TMax - 100.3 ; U/A -  Negative - CXR 02/21/19 - Potential pneumonia and or effusion in the left lung base-> Unasyn 12/6>> repeat CXR 12/8 w/ mild pulm edema, atx - will get OOB today  Anemia 10.0   Hypokalemia 3.4->3.2- supplement - 4.4  Hospital day # 3 Patient is neurologically stable and doing well.  Chest x-ray shows mild pulmonary edema versus atelectasis.  She has already been started on Unasyn.  Recommend mobilize out of bed physical occupational therapy consults.  2D echo shows moderate mitral stenosis with severe mitral annulus calcification and mild rigid".  Will obtain cardiology consult to opine on management of this and to sheds light on why she was on long-term anticoagulation will she need anticoagulation to be  resumed 4 to 6 weeks from now.  Mobilize out of bed.  Transfer to neurology floor bed.  Greater than 50% time during this 35-minute  follow-up visit was spent on counseling and coordination of care discussion with care team  Delia HeadyPramod , MD To contact Stroke Continuity provider, please refer to [email or web address]. After hours, contact General Neurology

## 2019-02-24 NOTE — Evaluation (Signed)
Occupational Therapy Evaluation Patient Details Name: Kimberly Mullins MRN: [id number] DOB: [birth year] Today's Date: 02/24/2019    History of Present Illness Kimberly Mullins is a 85 y.o. female with past medical history significant for hypertension, diabetes mellitus, stroke with mild left-sided residual deficits presents to the emergency department with generalized weakness and difficulty walking since yesterday night. Head CT which revealed a small hemorrhage in the genu of the corpus callosum bilaterally with mild localized edema but no mass-effect or intraventricular hemorrhage.     Clinical Impression   Pt progressing well with this session. Pt standing for light grooming at sink with minA overall for stability and balance in standing with 1 arm brushing her hair while the other is holding onto the RW. Pt able to tolerate standing for 4 mins. Pt mobilizing with minA to modA for stability and chair follow available. Pt requires cues to stand inside of RW. Pt with increased time for turn and for all ADL. Pt would benefit from continued OT skilled services for ADL, mobility and safety in SNF setting. OT following acutely.    Follow Up Recommendations  SNF;Supervision/Assistance - 24 hour    Equipment Recommendations  Other (comment)(to be determined at next facility)    Recommendations for Other Services       Precautions / Restrictions Precautions Precautions: Fall Restrictions Weight Bearing Restrictions: No      Mobility Bed Mobility Overal bed mobility: Needs Assistance             General bed mobility comments: seated in recliner  Transfers Overall transfer level: Needs assistance Equipment used: Rolling walker (2 wheeled) Transfers: Sit to/from Stand Sit to Stand: Mod assist;From elevated surface         General transfer comment: power up with modA overall    Balance Overall balance assessment: Needs assistance Sitting-balance support: Bilateral upper  extremity supported;Feet supported Sitting balance-Leahy Scale: Poor Sitting balance - Comments: reliant on bilateral hand support   Standing balance support: Bilateral upper extremity supported Standing balance-Leahy Scale: Poor Standing balance comment: reliant on RW                           ADL either performed or assessed with clinical judgement   ADL Overall ADL's : Needs assistance/impaired Eating/Feeding: Modified independent;Sitting   Grooming: Set up;Sitting                               Functional mobility during ADLs: Minimal assistance;Rolling walker;Cueing for safety General ADL Comments: Pt with decreased strength, decreased activity tolerance, and poor ability to care for self.     Vision   Vision Assessment?: No apparent visual deficits     Perception     Praxis      Pertinent Vitals/Pain Pain Assessment: Faces Faces Pain Scale: Hurts a little bit Pain Location: back Pain Descriptors / Indicators: Grimacing Pain Intervention(s): Limited activity within patient's tolerance     Hand Dominance     Extremity/Trunk Assessment Upper Extremity Assessment Upper Extremity Assessment: Generalized weakness   Lower Extremity Assessment Lower Extremity Assessment: Generalized weakness       Communication     Cognition Arousal/Alertness: Awake/alert Behavior During Therapy: WFL for tasks assessed/performed Overall Cognitive Status: Within Functional Limits for tasks assessed  General Comments  Pt's SPO2 levels >93% on RA    Exercises     Shoulder Instructions      Home Living                                          Prior Functioning/Environment                   OT Problem List:        OT Treatment/Interventions:      OT Goals(Current goals can be found in the care plan section) Acute Rehab OT Goals Patient Stated Goal: to go home OT Goal  Formulation: With patient Time For Goal Achievement: 03/08/19 Potential to Achieve Goals: Good ADL Goals Pt Will Perform Grooming: with supervision;standing Pt Will Perform Lower Body Dressing: with min assist;sitting/lateral leans;sit to/from stand Pt Will Transfer to Toilet: with min guard assist;ambulating;bedside commode Pt Will Perform Toileting - Clothing Manipulation and hygiene: with min guard assist;sitting/lateral leans;sit to/from stand Pt/caregiver will Perform Home Exercise Program: Increased strength;Both right and left upper extremity;With Supervision  OT Frequency: Min 2X/week   Barriers to D/C:            Co-evaluation              AM-PAC OT "6 Clicks" Daily Activity     Outcome Measure Help from another person eating meals?: None Help from another person taking care of personal grooming?: A Little Help from another person toileting, which includes using toliet, bedpan, or urinal?: A Lot Help from another person bathing (including washing, rinsing, drying)?: A Lot Help from another person to put on and taking off regular upper body clothing?: A Little Help from another person to put on and taking off regular lower body clothing?: Total 6 Click Score: 15   End of Session Equipment Utilized During Treatment: Gait belt;Rolling walker Nurse Communication: Mobility status  Activity Tolerance: Patient tolerated treatment well Patient left: in chair;with call bell/phone within reach;with chair alarm set  OT Visit Diagnosis: Unsteadiness on feet (R26.81);Muscle weakness (generalized) (M62.81)                Time: 5456-2563 OT Time Calculation (min): 30 min Charges:  OT General Charges $OT Visit: 1 Visit OT Treatments $Self Care/Home Management : 8-22 mins  Darryl Nestle) Marsa Aris OTR/L Acute Rehabilitation Services Pager: [phone] Office: [phone]    Jenene Slicker [name] 02/24/2019, 3:08 PM

## 2019-02-24 NOTE — Plan of Care (Signed)
  Problem: Activity: Goal: Risk for activity intolerance will decrease Outcome: Progressing   Problem: Nutrition: Goal: Adequate nutrition will be maintained Outcome: Progressing   Problem: Elimination: Goal: Will not experience complications related to bowel motility Outcome: Progressing Goal: Will not experience complications related to urinary retention Outcome: Progressing   Problem: Safety: Goal: Ability to remain free from injury will improve Outcome: Progressing   Problem: Self-Care: Goal: Ability to participate in self-care as condition permits will improve Outcome: Progressing Goal: Verbalization of feelings and concerns over difficulty with self-care will improve Outcome: Progressing   Problem: Nutrition: Goal: Dietary intake will improve Outcome: Progressing   Problem: Intracerebral Hemorrhage Tissue Perfusion: Goal: Complications of Intracerebral Hemorrhage will be minimized Outcome: Progressing   Problem: Coping: Goal: Level of anxiety will decrease Outcome: Completed/Met

## 2019-02-24 NOTE — Progress Notes (Addendum)
2145 Pt transferred via bed from 4N ICU. Pt A&Ox4, updated with POC, assessed, see flow sheet, pad changed under patient, and answered all questions. Pt denies pain, SOB. Fall precautions in place, [facility]. Belongings at bedside documented in flowsheet.  2215 Pt medicated per MAR. All needs met at this time, [facility].   2345 RN to room for alarm, O2 sats in mid 80s, oxygen applied via [place], O2 sats now in upper 90s. Pt stated she felt wet, pad changed and peri care performed, RN emptying urine cannister and pt voided again, pericare and pad changed again. RN noted IV in right upper arm to be infiltrated, IV removed and warm compress applied. New IV inserted. Pt resting comfortably, reassement documented per flowsheet. Fall precautions in place, [facility].

## 2019-02-25 ENCOUNTER — Encounter (HOSPITAL_COMMUNITY): Payer: Self-pay | Admitting: Student

## 2019-02-25 DIAGNOSIS — I4891 Unspecified atrial fibrillation: Secondary | ICD-10-CM

## 2019-02-25 DIAGNOSIS — I160 Hypertensive urgency: Secondary | ICD-10-CM

## 2019-02-25 DIAGNOSIS — R079 Chest pain, unspecified: Secondary | ICD-10-CM

## 2019-02-25 LAB — GLUCOSE, CAPILLARY
Glucose-Capillary: 112 mg/dL — ABNORMAL HIGH (ref 70–99)
Glucose-Capillary: 149 mg/dL — ABNORMAL HIGH (ref 70–99)
Glucose-Capillary: 115 mg/dL — ABNORMAL HIGH (ref 70–99)
Glucose-Capillary: 102 mg/dL — ABNORMAL HIGH (ref 70–99)

## 2019-02-25 LAB — TSH: TSH: 1.297 u[IU]/mL (ref 0.350–4.500)

## 2019-02-25 LAB — BASIC METABOLIC PANEL
Anion gap: 10 (ref 5–15)
BUN: 20 mg/dL (ref 8–23)
CO2: 24 mmol/L (ref 22–32)
Calcium: 8.9 mg/dL (ref 8.9–10.3)
Chloride: 100 mmol/L (ref 98–111)
Creatinine, Ser: 1.15 mg/dL — ABNORMAL HIGH (ref 0.44–1.00)
GFR calc Af Amer: 50 mL/min — ABNORMAL LOW (ref >60)
GFR calc non Af Amer: 43 mL/min — ABNORMAL LOW (ref >60)
Glucose, Bld: 146 mg/dL — ABNORMAL HIGH (ref 70–99)
Potassium: 4 mmol/L (ref 3.5–5.1)
Sodium: 134 mmol/L — ABNORMAL LOW (ref 135–145)

## 2019-02-25 LAB — SARS CORONAVIRUS 2 (TAT 6-24 HRS): SARS Coronavirus 2: NEGATIVE

## 2019-02-25 LAB — MAGNESIUM: Magnesium: 1.5 mg/dL — ABNORMAL LOW (ref 1.7–2.4)

## 2019-02-25 MED ORDER — METOPROLOL TARTRATE 5 MG/5ML IV SOLN
2.5000 mg | Freq: Once | INTRAVENOUS | Status: AC
  Administered 2019-02-25: 2.5 mg via INTRAVENOUS
  Filled 2019-02-25: qty 5

## 2019-02-25 MED ORDER — METOPROLOL TARTRATE 5 MG/5ML IV SOLN
2.5000 mg | INTRAVENOUS | Status: AC
  Administered 2019-02-25: 2.5 mg via INTRAVENOUS
  Filled 2019-02-25: qty 5

## 2019-02-25 MED ORDER — DILTIAZEM HCL-DEXTROSE 125-5 MG/125ML-% IV SOLN (PREMIX)
5.0000 mg/h | INTRAVENOUS | Status: DC
  Administered 2019-02-25: 5 mg/h via INTRAVENOUS
  Filled 2019-02-25: qty 125

## 2019-02-25 MED ORDER — LISINOPRIL 20 MG PO TABS
20.0000 mg | ORAL_TABLET | Freq: Every day | ORAL | Status: DC
  Administered 2019-02-26: 10:00:00 20 mg via ORAL
  Filled 2019-02-25: qty 1

## 2019-02-25 MED ORDER — ASPIRIN EC 81 MG PO TBEC
81.0000 mg | DELAYED_RELEASE_TABLET | Freq: Every day | ORAL | Status: DC
  Administered 2019-02-25 – 2019-02-27 (×3): 81 mg via ORAL
  Filled 2019-02-25 (×3): qty 1

## 2019-02-25 MED ORDER — METOPROLOL SUCCINATE ER 25 MG PO TB24
75.0000 mg | ORAL_TABLET | Freq: Every day | ORAL | Status: DC
  Administered 2019-02-26 – 2019-02-27 (×2): 75 mg via ORAL
  Filled 2019-02-25 (×2): qty 3

## 2019-02-25 NOTE — Progress Notes (Signed)
HR continues to sustain afib in 150-170's after 2nd dose IV metoprolol given. RR RN and NP notified, cardizem gtt ordered. Waiting for cardizem to be tubed from pharmacy at this time.

## 2019-02-25 NOTE — Consult Note (Signed)
Cardiology Consultation:   Patient ID: [name] MRN: [id number]; DOB: [birth year]  Admit date: 02/20/2019 Date of Consult: 02/25/2019  Primary Care Provider: Salli Real, MD Primary Cardiologist: New to [facility] (Dr. Mayford Knife) Primary Electrophysiologist:  None    Patient Profile:   Kimberly Mullins is a 85 y.o. female with a history of stroke with mild left-sided residual deficits, hypertension, diabetes mellitus, and rheumatic fever as a child who is being seen today for the evaluation of mitral stenosis and atrial fibrillation with RVR at the request of Dr. Pearlean Brownie.  History of Present Illness:   Kimberly Mullins is a 85 year old female with the above history. Patient reportedly has a history of cardiac thrombus in the 1990's for which she was started on Coumadin. She has been continued on Coumadin since that time due to prior stroke. Patient does not think she has ever seen a Cardiologist. Of note, patient states she had rheumatic fever as a child multiple times. During one severe episode at the age of 78, she was bedbound for 7 months. Following this, she was told that she had a very mild murmur that "you could not hear with a regular stethoscope."  Patient presented to the ED via EMS on 02/20/2019 from her assisted living facility for further evaluation of bilateral leg weakness. Patient has some left sided deficits from prior stroke but patient reported left side being more weak than normal. Head CT showed small hemorrhage in the genu of the corpus callosum bilaterally with mild localized edema but no mass-effect or intraventricular hemorrhage. Systolic BP was greater than 180's in the ED and patient was started on Labetalol and Cardene drip. Home Coumadin was held. Echo on 02/23/2019 showed LVEF of 55-60% with normal wall motion, mild to moderate mitral stenosis with severe mitral annular calcification, mild aortic stenosis, and trace tricuspid regurgitation. She has done well from a Neuro standpoint  this admission. However, she did go into atrial fibrillation with rates as high as the 190's  today. She was started on IV Cardizem. Cardiology consulted for assistance with atrial fibrillation of RVR as well as anticoagulation recommendations given mitral stenosis and history of cardiac thrombus.  At the time of this evaluation, patient resting comfortably eating lunch. She has since converted back to normal sinus rhythm. She reports palpitations with episode of atrial fibrillation earlier today but denies any lightheadedness, dizziness, or near syncope. She states she has never been diagnosed with atrial fibrillation before. She does report palpitations though every time she eats. She also reports chest pain solely related to meals. She states this has been going on for 10-20 years and does not seem to be getting worse. She has tried reflux medications before with improvement. No exertional chest pain. No shortness of breath, orthopnea, or PND. She notes occasional ankle swelling if her feet have been hanging down for a while which improves with elevation. No recent fevers or illnesses.   No known family history of atrial fibrillation. However, father does have a history of CAD and reportedly died at the age of 78.   Heart Pathway Score:     Past Medical History:  Diagnosis Date  . Diabetes mellitus without complication (HCC)   . Hypertension   . Stroke [facility])     Past Surgical History:  Procedure Laterality Date  . APPENDECTOMY       Home Medications:  Prior to Admission medications   Medication Sig Start Date End Date Taking? Authorizing Provider  acetaminophen (TYLENOL) 325  MG tablet Take 650 mg by mouth every 6 (six) hours as needed for mild pain, fever or headache.   Yes [provider]  calcium carbonate (TUMS - DOSED IN MG ELEMENTAL CALCIUM) 500 MG chewable tablet Chew 1 tablet by mouth 3 (three) times daily as needed for indigestion or heartburn.   Yes [provider]  cetirizine (ZYRTEC) 10 MG tablet Take 10 mg by mouth daily.   Yes [provider]  cholecalciferol (VITAMIN D3) 25 MCG (1000 UT) tablet Take 1,000 Units by mouth 2 (two) times daily.    Yes [provider]  fenofibrate micronized (LOFIBRA) 134 MG capsule Take 134 mg by mouth daily before breakfast.   Yes [provider]  lisinopril (ZESTRIL) 20 MG tablet Take 20 mg by mouth daily.   Yes [provider]  loperamide (LOPERAMIDE A-D) 2 MG tablet Take 2 mg by mouth 4 (four) times daily as needed for diarrhea or loose stools.   Yes [provider]  metFORMIN (GLUCOPHAGE) 500 MG tablet Take 500 mg by mouth 2 (two) times daily with a meal.   Yes [provider]  metoprolol succinate (TOPROL-XL) 50 MG 24 hr tablet Take 50 mg by mouth daily. Take with or immediately following a meal.   Yes [provider]  Omega-3 1000 MG CAPS Take 1,000 mg by mouth daily.    Yes [provider]  oxybutynin (DITROPAN) 5 MG tablet Take 5 mg by mouth 2 (two) times daily.   Yes [provider]  Simethicone 80 MG TABS Take 2-4 tablets by mouth daily as needed (gas).   Yes [provider]  simvastatin (ZOCOR) 40 MG tablet Take 40 mg by mouth at bedtime.    Yes [provider]  vitamin C (ASCORBIC ACID) 250 MG tablet Take 250 mg by mouth 2 (two) times daily.   Yes [provider]  warfarin (COUMADIN) 5 MG tablet Take 2.5-5 mg by mouth See admin instructions. Take 5 mg by mouth at bedtime on Sun/Mon/Wed/Fri/Sat and 2.5 mg on Tues/Thurs   Yes [provider]    Inpatient Medications: Scheduled Meds: . Chlorhexidine Gluconate Cloth  6 each Topical Daily  . cholecalciferol  1,000 Units Oral BID  . insulin aspart  0-9 Units Subcutaneous TID WC  . lisinopril  30 mg Oral Daily  . loratadine  10 mg Oral Daily  . metoprolol succinate  50 mg Oral Daily  . omega-3 acid ethyl esters  1,000 mg Oral Daily  .  oxybutynin  5 mg Oral BID  . pantoprazole  40 mg Oral QHS  . senna-docusate  1 tablet Oral BID  . vitamin C  250 mg Oral BID   Continuous Infusions: . sodium chloride 10 mL/hr at 02/25/19 0858  . ampicillin-sulbactam (UNASYN) IV Stopped (02/25/19 0707)  . diltiazem (CARDIZEM) infusion 5 mg/hr (02/25/19 1133)   PRN Meds: sodium chloride, acetaminophen **OR** acetaminophen (TYLENOL) oral liquid 160 mg/5 mL **OR** acetaminophen, calcium carbonate, labetalol  Allergies:    Allergies  Allergen Reactions  . Erythromycin Hives    Social History:   Social History   Socioeconomic History  . Marital status: Married    Spouse name: Not on file  . Number of children: Not on file  . Years of education: Not on file  . Highest education level: Not on file  Occupational History  . Not on file  Social Needs  . Financial resource strain: Not on file  . Food insecurity  Worry: Not on file    Inability: Not on file  . Transportation needs    Medical: Not on file    Non-medical: Not on file  Tobacco Use  . Smoking status: Never Smoker  . Smokeless tobacco: Never Used  Substance and Sexual Activity  . Alcohol use: Never    Frequency: Never  . Drug use: Never  . Sexual activity: Not on file  Lifestyle  . Physical activity    Days per week: Not on file    Minutes per session: Not on file  . Stress: Not on file  Relationships  . Social Musician on phone: Not on file    Gets together: Not on file    Attends religious service: Not on file    Active member of club or organization: Not on file    Attends meetings of clubs or organizations: Not on file    Relationship status: Not on file  . Intimate partner violence    Fear of current or ex partner: Not on file    Emotionally abused: Not on file    Physically abused: Not on file    Forced sexual activity: Not on file  Other Topics Concern  . Not on file  Social History Narrative  . Not on file    Family History:     Family History  Problem Relation Age of Onset  . CAD Father        died at age 44  . Atrial fibrillation Neg Hx      ROS:  Please see the history of present illness.  Review of Systems  Constitutional: Negative for chills and fever.  HENT: Negative for congestion.   Eyes: Negative for blurred vision.  Respiratory: Negative for cough.   Cardiovascular: Positive for chest pain, palpitations and leg swelling. Negative for orthopnea and PND.  Gastrointestinal: Positive for heartburn. Negative for abdominal pain.  Musculoskeletal: Positive for joint pain.  Neurological: Positive for weakness and headaches (occasionially). Negative for dizziness, tingling and loss of consciousness.  Psychiatric/Behavioral: Negative for substance abuse.   All other ROS reviewed and negative.     Physical Exam/Data:   Vitals:   02/25/19 1205 02/25/19 1212 02/25/19 1220 02/25/19 1235  BP: (!) 121/106  (!) 109/55 (!) 110/52  Pulse: (!) 148 96 91 92  Resp: (!) 28 (!) 21 (!) 24 20  Temp:      TempSrc:      SpO2: 96% 94% 90% 92%  Weight:      Height:        Intake/Output Summary (Last 24 hours) at 02/25/2019 1318 Last data filed at 02/25/2019 0858 Gross per 24 hour  Intake 709.45 ml  Output 2100 ml  Net -1390.55 ml   Last 3 Weights 02/24/2019 02/21/2019 10/25/2018  Weight (lbs) 163 lb 2.3 oz 151 lb 7.3 oz 154 lb  Weight (kg) 74 kg 68.7 kg 69.854 kg     Body mass index is 29.84 kg/m.  General: 85 y.o. Caucasian female resting comfortably in no acute distress. HEENT: Normocephalic and atraumatic. Sclera clear. EOMs intact. Neck: Supple. No carotid bruits. No JVD. Heart: RRR. Distinct S1 and S2. Soft murmur heard at upper sternal border. No gallops or rubs. Radial and distal pedal pulses 2+ and equal bilaterally. Lungs: No increased work of breathing. Clear to ausculation bilaterally. No wheezes, rhonchi, or rales.  Abdomen: Soft, non-distended, and non-tender to palpation. Bowel sounds present.  MSK: Normal strength and tone for age.  Extremities: No clubbing, cyanosis, or edema.    Skin: Warm and dry. Neuro: Alert and oriented x3. No focal deficits. Psych: Normal affect. Responds appropriately.   EKG:  The EKG was personally reviewed and demonstrates:  Atrial fibrillation, rate in the 140's with diffuse ST depressions.  Telemetry:  Telemetry was personally reviewed and demonstrates:  Went into atrial fibrillation with rates as high rates in the 130's to 160's but as high as the 190's this morning around 8:20am. Converted back to sinus rhythm with rates in the 90's around 12:15pm. Multiple runs of non-sustained VT noted with longest run being 10 beats. Frequent PVCs/PACs.  Relevant CV Studies:  Echocardiogram 02/23/2019: Impressions:  1. Left ventricular ejection fraction, by visual estimation, is 55 to 60%. The left ventricle has normal function. There is no left ventricular hypertrophy.  2. The left ventricle has no regional wall motion abnormalities.  3. Global right ventricle has normal systolic function.The right ventricular size is normal. No increase in right ventricular wall thickness.  4. Left atrial size was mildly dilated.  5. Right atrial size was normal.  6. Moderate calcification of the mitral valve leaflet(s).  7. Moderate thickening of the mitral valve leaflet(s).  8. Severe mitral annular calcification.  9. The mitral valve is abnormal. Mild mitral valve regurgitation. Mild-moderate mitral stenosis. 10. Mitral stenosis mild by pressure half time/mitral valve area, moderate by gradient. No prior for comparison. 11. The tricuspid valve is normal in structure. Tricuspid valve regurgitation is trivial. 12. The aortic valve was not well visualized. Aortic valve regurgitation is trivial. Mild aortic valve stenosis. 13. The pulmonic valve was not well visualized. Pulmonic valve regurgitation is not visualized. 14. The inferior vena cava is dilated in size with >50%  respiratory variability, suggesting right atrial pressure of 8 mmHg. 15. The atrial septum is grossly normal.   Laboratory Data:  High Sensitivity Troponin:  No results for input(s): TROPONINIHS in the last 720 hours.   Chemistry Recent Labs  Lab 02/22/19 0657 02/23/19 0633 02/24/19 0506  NA 136 141 141  K 3.4* 3.2* 4.4  CL 105 107 106  CO2 21* 24 24  GLUCOSE 116* 123* 104*  BUN 22 14 12   CREATININE 0.76 0.78 0.68  CALCIUM 8.6* 8.8* 9.3  GFRNONAA >60 >60 >60  GFRAA >60 >60 >60  ANIONGAP 10 10 11     No results for input(s): PROT, ALBUMIN, AST, ALT, ALKPHOS, BILITOT in the last 168 hours. Hematology Recent Labs  Lab 02/22/19 0657 02/23/19 0633 02/24/19 0506  WBC 9.5 9.7 10.0  RBC 3.46* 3.39* 3.69*  HGB 10.2* 10.0* 10.9*  HCT 30.9* 31.0* 33.5*  MCV 89.3 91.4 90.8  MCH 29.5 29.5 29.5  MCHC 33.0 32.3 32.5  RDW 13.2 13.2 13.2  PLT 174 165 185   BNPNo results for input(s): BNP, PROBNP in the last 168 hours.  DDimer No results for input(s): DDIMER in the last 168 hours.   Radiology/Studies:  Dg Chest Port 1 View  Result Date: 02/24/2019 CLINICAL DATA:  Pneumonia EXAM: PORTABLE CHEST 1 VIEW COMPARISON:  Three days ago FINDINGS: Artifact from EKG leads. Blunting at the lateral left costophrenic sulcus that is unchanged. Normal heart size when accounting for rotation. Large lung volumes with diaphragm flattening. Mild interstitial with a few Kerley lines. IMPRESSION: 1. Stable mild opacification at the left base favoring pleural fluid. 2. A few Kerley lines are now noted, question developing edema. Electronically Signed   By: Marnee SpringJonathon  Watts M.D.   On: 02/24/2019 05:41  Assessment and Plan:   New Onset Atrial Fibrillation - Patient went into atrial fibrillation with RVR earlier today but converted back to normal sinus rhythm after being started on Cardizem drip. No known history of atrial fibrillation. - Maintaining sinus rhythm at this time. Frequent PACs/PVCs and  multiple runs of non-sustained VT (longest being 10 beats) noted on telemetry.  - Echo showed normal EF and wall motion. - Potassium 4.4 yesterday. Will recheck. - Will check Magnesium and TSH. - Will stop IV Cardizem. - Will increase home Toprol-XL to 75mg  daily starting tomorrow. - CHA2DS2-VASc = 7 (HTN, DM, stroke x2, age x2, gender). Cannot being anticoagulated at this time due to hemorrhagic stroke. However, would plan to restart Coumadin when OK per Neurology. Per Neurology notes, will need to be off Coumadin for 1 month.  Chest Pain - Patient reports history of chest pain solely with meals. Non-exertional. Currently chest pain free. - EKG today did show diffuse ST depressions while in atrial fibrillation with RVR. - Discussed with MD - will plan for outpatient Lexiscan for further ischemic evaluation.  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  - Patient does have history of rheumatic fever on exam.  Hypertension - BP as high as 181/102 on admission and patient on Labetalol and Cardene drip. - Home medications include Lisinopril 20mg  daily and Toprol-XL 50mg  daily. - Labetolol and Cardene have been stopped and home Lisinopril increased to 30mg  daily.  - Most recent BP 110/52. - Discussed with MD - will increase Toprol to 75mg  daily and decrease Lisinopril back down to home dose of 20mg  daily.  Hemorrhagic Stroke - Management per Neuro.  History of Cardiac Thrombus - Patient reportedly has history of cardiac thrombus found at time of first stroke in 1992. Has been on Coumadin since then. - No LV thrombus noted on Echo this admission. - Given atrial fibrillation and mitral stenosis, will plan to restart Coumadin in about 1 month when cleared by Neuro.  For questions or updates, please contact [place] Please consult [email or web address] for contact info under     Signed, Darreld Mclean, PA-C   02/25/2019 1:18 PM

## 2019-02-25 NOTE — Significant Event (Signed)
Rapid Response Event Note  Overview: Time Called: 1045 Arrival Time: 1050 Event Type: Cardiac  Initial Focused Assessment: Called to bedside for patient being tachycardic. Upon arrival, MD was at bedside assessing patient. Dr. Leonie Man recommended starting a Cardizem gtt. Patient in no distress and is joking candidly with staff at bedside. Lung sounds clear in the upper lobes and clear, diminished in the lower lobes. HR 130-190 bpm via dinamap and verfied with auscultation, BP 128/100, RR 20-27, SpO2 100%.   Patient's son Karn Pickler at bedside and updated.   Interventions: Cardizem gtt, Dr. Leonie Man did not recommend bolus of Cardizem on initiation. Second IV site obtained for Cardizem administration.   Plan of Care (if not transferred): If heart rate control is not achieved or if patient becomes symptomatic from elevated heart rate, RN to notify Dr. Leonie Man. MD will consider bolus of Cardizem at that time.   RN to call Rapid Response for further needs.   Casimer Bilis

## 2019-02-25 NOTE — Care Management Important Message (Signed)
Important Message  Patient Details  Name: Kimberly Mullins MRN: [id number] Date of Birth: 1931/05/13   Medicare Important Message Given:  Yes     Orbie Pyo 02/25/2019, 2:59 PM

## 2019-02-25 NOTE — Progress Notes (Addendum)
0130 Pt sleeping comfortably, NAD, WCTM.  0315 Pt sleeping comfortably, NAD, WCTM.  0345 Pt sleeping, easy to arouse, VS checked. BP above parameters, medicated per MAR, WCTM.  0425 BP rechecked, WNL, IV noted to be infiltrated. IV removed, new IV inserted. Pt slept through the entire process. WCTM.   0630 Pt still sleeping, IV intact and infusing IV ABX, fall precautions in place, WCTM.

## 2019-02-25 NOTE — Progress Notes (Signed)
STROKE TEAM PROGRESS NOTE   INTERVAL HISTORY  RN reports atrial fibrillation this am. Pt reports no known hx AF. ? Is this is new or not. Either way, HR elevated 150-170, not controlled after 2 doses metoprolol spoke with EP card and will start cardizem- cardiology consulted yesterday and  Are yet to see her.  Patient is neurologically unchanged despite rapid heart rate and blood pressure is maintained.  OBJECTIVE Vitals:   02/25/19 0804 02/25/19 0830 02/25/19 0953 02/25/19 0957  BP:  124/67 117/82 (!) 115/92  Pulse:  (!) 142 (!) 147 (!) 134  Resp:  (!) 21 (!) 25 (!) 26  Temp:      TempSrc:      SpO2: 92% 94% 92% 92%  Weight:      Height:        CBC:  Recent Labs  Lab 02/21/19 0333  02/23/19 0633 02/24/19 0506  WBC 11.3*   < > 9.7 10.0  NEUTROABS 7.3  --   --   --   HGB 12.4   < > 10.0* 10.9*  HCT 37.6   < > 31.0* 33.5*  MCV 89.5   < > 91.4 90.8  PLT 231   < > 165 185   < > = values in this interval not displayed.    Basic Metabolic Panel:  Recent Labs  Lab 02/23/19 0633 02/24/19 0506  NA 141 141  K 3.2* 4.4  CL 107 106  CO2 24 24  GLUCOSE 123* 104*  BUN 14 12  CREATININE 0.78 0.68  CALCIUM 8.8* 9.3    Lipid Panel:     Component Value Date/Time   CHOL 155 02/24/2019 0506   TRIG 86 02/24/2019 0506   HDL 60 02/24/2019 0506   CHOLHDL 2.6 02/24/2019 0506   VLDL 17 02/24/2019 0506   LDLCALC 78 02/24/2019 0506   HgbA1c:  Lab Results  Component Value Date   HGBA1C 6.1 (H) 02/21/2019   Urine Drug Screen:     Component Value Date/Time   LABOPIA NONE DETECTED 02/23/2019 1413   COCAINSCRNUR NONE DETECTED 02/23/2019 1413   LABBENZ NONE DETECTED 02/23/2019 1413   AMPHETMU NONE DETECTED 02/23/2019 1413   THCU NONE DETECTED 02/23/2019 1413   LABBARB NONE DETECTED 02/23/2019 1413    Alcohol Level     Component Value Date/Time   ETH <10 02/21/2019 0558    IMAGING  Ct Head Wo Contrast 02/21/2019 1. Focal parenchymal hemorrhage involving the anterior  genu of the corpus callosum bilaterally, left greater than right, with posterior extension along the body of the left corpus callosum. Mild localized edema without significant regional mass effect. Finding of uncertain etiology, with primary differential considerations including an acute hemorrhagic infarct, atypical hypertensive hemorrhage, or coagulopathic bleed. No visible underlying mass lesion or vascular malformation. No other findings to suggest acute traumatic injury.  2. Moderately advanced age-related cerebral atrophy with chronic small vessel ischemic disease and remote right basal ganglia lacunar infarct.   Portable Chest - 1 View 02/21/2019 Potential pneumonia and or effusion in the left lung base.  ECG - SR rate 91 BPM. (See cardiology reading for complete details)   PHYSICAL EXAM    Pleasant frail elderly Caucasian lady not in distress. . Afebrile. Head is nontraumatic. Neck is supple without bruit.    Cardiac exam no murmur or gallop. Lungs are clear to auscultation. Distal pulses are well felt. Neurological Exam : Awake alert oriented x2.  Diminished attention, recall.  No aphasia apraxia or dysarthria.  Extraocular movements are full range without nystagmus.  Blinks to threat bilaterally.  Motor system exam revealed no upper or lower extremity drift with symmetric equal strength in all 4 extremities except for mild weakness of left grip and intrinsic hand muscles.  Orbits right over left upper extremity.  Mild weakness of left hip flexors and ankle dorsiflexors only..  Plantars are downgoing.  Gait not tested.   ASSESSMENT/PLAN Ms. Jeanmarie PlantBarbara T Suddeth is a lovely 85 y.o. female with history of hypertension, diabetes mellitus, previous stroke with mild left-sided residual deficits, and Coumadin PTA (INR 2.0) presents to the emergency department with generalized weakness and difficulty walking. She did not receive IV t-PA due to hemorrhage.  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  Sars Corona Virus 2 - negative  LDL - 78   HgbA1c - 6.1  UDS - negative  VTE prophylaxis - SCDs  warfarin daily prior to admission, now on No antithrombotic given hemorrhage    Therapy recommendations:  SNF  Disposition:  Pending  Transferred to the floor 12/7 - awaiting bed  Atrial Fibrillation w/ RVR  Possible reason for home anticoagulation:  warfarin daily   Last INR 1.4  CHA2DS2-VASc Score =  , ?2 oral anticoagulation recommended  Age in Years:  ?[age]   +2    Sex:  Female   Female   +1    Hypertension History:  yes   +1     Diabetes Mellitus:  yes   +1  Congestive Heart Failure History:   yes   +1  Vascular Disease History:   yes   +1     Stroke/TIA/Thromboembolism History:   yes   +2 . Continue aspirin 81 mg daily at discharge  Mitral Stenosis Chronic Warfarin use  Warfarin (INR 2.0) -> vitamin K - Repeat Protime 12/5 ->1.4    Pt reports hx cardiac thrombus 01/19/1991 admission [facility]  Only other documentation since that time is from warfarin clinic [facility] where they continued warfarin in setting of prior stroke.  Have [facility] to see if we can review that admission  Not a candidate for [facility] now, likely can resume in 1 month but unsure that is necessary  Hypertensive Emergency  BP 181/102 on arrival to ED  Home BP meds: Zestril 20; Toprol 50 XL daily  Current BP meds: Zestril 30, Toprol 50 XL daily  Was on cardene, now off  BP Stable 140-150s . SBP goal < 160 mm Hg   . Long-term BP goal normotensive  Hyperlipidemia  Home Lipid lowering medication: Zocor 40 mg daily  LDL - 78, goal < 70  Current lipid lowering medication: None  (statin contraindicated with ICH)  Resume statin at discharge  Diabetes type II  Home diabetic meds: glucophage  Current diabetic meds: SSI  HgbA1c - 6.1, goal < 7.0 Recent Labs    02/24/19 1703 02/24/19 2154 02/25/19 0604  GLUCAP 106* 112* 112*   Other Stroke Risk Factors  Advanced age  Hx stroke/TIA -  remote right basal ganglia lacunar infarct (no details available in Epic)  Other Active Problems  Possible aspiration PNA. Mild Leukocytosis, resolved - 10.0; TMax - 100.3 ; U/A -  Negative - CXR 02/21/19 -  Potential pneumonia and or effusion in the left lung base-> Unasyn 12/6>> repeat CXR 12/8 w/ mild pulm edema, atx - will get OOB today  Anemia 10.0   Hypokalemia 3.4->3.2- supplement - 4.4  Hospital day # 4  Patient has developed atrial fibrillation with rapid heart rate.  Start Cardizem drip at 5 mg/h.  Continue home dose metoprolol.  Await cardiology consult and recommendations.  Patient has likely valvular atrial fibrillation and may not be a candidate for newer anticoagulants but she clearly had intracerebral hemorrhage on warfarin but given her advanced age she may not be the best candidate for watchman device either.  We will start her on aspirin 81 mg daily for the time being for now.  Patient will likely need transfer for rehabilitation to SNF in the next few days when stable.  Long discussion with the patient and her son at the bedside and answered questions.  Son's FMLA paperwork was filled out.  Greater than 50% time during this 35-minute visit was spent on counseling and coordination of care about her atrial fibrillation and discussion about risk benefit of anticoagulation and stroke and answering questions.  Delia Heady, MD To contact Stroke Continuity provider, please refer to [email or web address]. After hours, contact General Neurology

## 2019-02-25 NOTE — Progress Notes (Signed)
Telemetry confirmed heart rhythm converted from afib back to normal sinus rhythm. Pt currently sustaining HR in the 90's in NSR on Cardizem gtt at 5mg /hr. Will continue to monitor

## 2019-02-25 NOTE — Progress Notes (Signed)
Telemetry notified of HR in 170-180. RN arrived in room to assess patient and pt states she feels like her heart is racing and she just wants to drink her coffee. HR sustaining in the 140's-150's, showing afib on the monitor. The pt states she has never been told she has afib or and irregular hr. Orders received for PRN dose of IV metoprolol.

## 2019-02-25 NOTE — TOC Progression Note (Signed)
Transition of Care [facility]) - Progression Note    Patient Details  Name: Kimberly Mullins MRN: [id number] Date of Birth: 1932-02-06  [facility] CM/SW Contact  Rae Mar, RN Phone Number: 02/25/2019, 4:37 PM  Clinical Narrative:     CM spoke with Mrs. Deon Pilling, pt's daughter and health care POA, to discuss the SNF offer of [place].  CM discussed the medicare star rating of the facility.  She advised she would like to talk with her mom, her brother, and her husband (financial POA).  CM received a return call from Mr. Bowman, pt's son-in-law and financial POA, to discuss [place] and financial matters.  They accepted [facility] as their choice.  CM left a VM with Claiborne Billings at [facility] advising that pt and family have chosen their facility  [facility] team will continue to follow.  Expected Discharge Plan: [place] Barriers to Discharge: Continued Medical Work up  Expected Discharge Plan and Services Expected Discharge Plan: [place]   Discharge Planning Services: CM Consult Post Acute Care Choice: [place] Living arrangements for the past 2 months: [place]                                       Social Determinants of Health (SDOH) Interventions    Readmission Risk Interventions No flowsheet data found.

## 2019-02-26 LAB — GLUCOSE, CAPILLARY
Glucose-Capillary: 95 mg/dL (ref 70–99)
Glucose-Capillary: 106 mg/dL — ABNORMAL HIGH (ref 70–99)
Glucose-Capillary: 137 mg/dL — ABNORMAL HIGH (ref 70–99)
Glucose-Capillary: 111 mg/dL — ABNORMAL HIGH (ref 70–99)

## 2019-02-26 NOTE — Progress Notes (Signed)
Progress Note  Patient Name: Kimberly Mullins Date of Encounter: 02/26/2019  Primary Cardiologist: No primary care provider on file.   Subjective   Remains in sinu tachycardia on tele.  Denies any CP or SOB  Inpatient Medications    Scheduled Meds: . aspirin EC  81 mg Oral Daily  . Chlorhexidine Gluconate Cloth  6 each Topical Daily  . cholecalciferol  1,000 Units Oral BID  . insulin aspart  0-9 Units Subcutaneous TID WC  . lisinopril  20 mg Oral Daily  . loratadine  10 mg Oral Daily  . metoprolol succinate  75 mg Oral Daily  . omega-3 acid ethyl esters  1,000 mg Oral Daily  . oxybutynin  5 mg Oral BID  . pantoprazole  40 mg Oral QHS  . senna-docusate  1 tablet Oral BID  . vitamin C  250 mg Oral BID   Continuous Infusions: . sodium chloride Stopped (02/25/19 1405)  . ampicillin-sulbactam (UNASYN) IV 3 g (02/26/19 0525)   PRN Meds: sodium chloride, acetaminophen **OR** acetaminophen (TYLENOL) oral liquid 160 mg/5 mL **OR** acetaminophen, calcium carbonate, labetalol   Vital Signs    Vitals:   02/25/19 1615 02/25/19 2051 02/25/19 2354 02/26/19 0344  BP: (!) 114/50 140/70 (!) 140/55 (!) 144/104  Pulse: 83  83 87  Resp: 20 18 17 18   Temp: 97.7 F (36.5 C) 97.9 F (36.6 C) 98 F (36.7 C) 97.9 F (36.6 C)  TempSrc: Oral Oral Oral Oral  SpO2: 93% 92% 93% 92%  Weight:      Height:        Intake/Output Summary (Last 24 hours) at 02/26/2019 0829 Last data filed at 02/25/2019 1412 Gross per 24 hour  Intake 273.74 ml  Output 350 ml  Net -76.26 ml   Filed Weights   02/21/19 0445 02/24/19 2151  Weight: 68.7 kg 74 kg    Telemetry    Sinus tachycardia in the 120's - Personally Reviewed  ECG    No new EKG to review - Personally Reviewed  Physical Exam   GEN: No acute distress.   Neck: No JVD Cardiac: regular and tachy, no murmurs, rubs, or gallops.  Respiratory: Clear to auscultation bilaterally. GI: Soft, nontender, non-distended  MS: No edema; No  deformity. Neuro:  Nonfocal  Psych: Normal affect   Labs    Chemistry Recent Labs  Lab 02/23/19 0633 02/24/19 0506 02/25/19 1426  NA 141 141 134*  K 3.2* 4.4 4.0  CL 107 106 100  CO2 24 24 24   GLUCOSE 123* 104* 146*  BUN 14 12 20   CREATININE 0.78 0.68 1.15*  CALCIUM 8.8* 9.3 8.9  GFRNONAA >60 >60 43*  GFRAA >60 >60 50*  ANIONGAP 10 11 10      Hematology Recent Labs  Lab 02/22/19 0657 02/23/19 0633 02/24/19 0506  WBC 9.5 9.7 10.0  RBC 3.46* 3.39* 3.69*  HGB 10.2* 10.0* 10.9*  HCT 30.9* 31.0* 33.5*  MCV 89.3 91.4 90.8  MCH 29.5 29.5 29.5  MCHC 33.0 32.3 32.5  RDW 13.2 13.2 13.2  PLT 174 165 185    Cardiac EnzymesNo results for input(s): TROPONINI in the last 168 hours. No results for input(s): TROPIPOC in the last 168 hours.   BNPNo results for input(s): BNP, PROBNP in the last 168 hours.   DDimer No results for input(s): DDIMER in the last 168 hours.   Radiology    No results found.  Cardiac Studies   2D echo 02/2019 IMPRESSIONS    1.  Left ventricular ejection fraction, by visual estimation, is 55 to 60%. The left ventricle has normal function. There is no left ventricular hypertrophy.  2. The left ventricle has no regional wall motion abnormalities.  3. Global right ventricle has normal systolic function.The right ventricular size is normal. No increase in right ventricular wall thickness.  4. Left atrial size was mildly dilated.  5. Right atrial size was normal.  6. Moderate calcification of the mitral valve leaflet(s).  7. Moderate thickening of the mitral valve leaflet(s).  8. Severe mitral annular calcification.  9. The mitral valve is abnormal. Mild mitral valve regurgitation. Mild-moderate mitral stenosis. 10. Mitral stenosis mild by pressure half time/mitral valve area, moderate by gradient. No prior for comparison. 11. The tricuspid valve is normal in structure. Tricuspid valve regurgitation is trivial. 12. The aortic valve was not well  visualized. Aortic valve regurgitation is trivial. Mild aortic valve stenosis. 13. The pulmonic valve was not well visualized. Pulmonic valve regurgitation is not visualized. 14. The inferior vena cava is dilated in size with >50% respiratory variability, suggesting right atrial pressure of 8 mmHg. 15. The atrial septum is grossly normal.  Patient Profile     85 y.o. female with a history of stroke with mild left-sided residual deficits, hypertension, diabetes mellitus, and rheumatic fever as a child who is being seen today for the evaluation of mitral stenosis and atrial fibrillation with RVR at the request of Dr. Pearlean BrownieSethi.  Assessment & Plan    New Onset Atrial Fibrillation - Patient went into atrial fibrillation with RVR while hospitalized but converted back to normal sinus rhythm after being started on Cardizem drip. No known history of atrial fibrillation. - She continues to remain in NSR - Echo showed normal EF and wall motion. - K+4 today - Mag low at 1.5 yesterday - TSH normal at 1.297 - Cardizem gtt stopped - she is in normal rhythm but tachycardic ? Etiology- TSH normal - increasing Toprol to 75mg  daily today - CHA2DS2-VASc = 7 (HTN, DM, stroke x2, age x2, gender). Cannot be anticoagulated at this time due to hemorrhagic stroke. However, would plan to restart Coumadin when OK per Neurology. Per Neurology notes, will need to be off Coumadin for 1 month.  Chest Pain - Patient reports history of chest pain solely with meals. Non-exertional. Currently chest pain free. - EKG today did show diffuse ST depressions while in atrial fibrillation with RVR. - 2D echo with normal LVF - recommend outpt nuclear stress test  Mitral Stenosis - Likely Rheumatic - Echo showed mild to moderate mitral stenosis with severe mitral annular calcification.  - Patient does have history of rheumatic fever on exam.  Hypertension - BP as high as 181/102 on admission and patient on Labetalol and Cardene  drip. - Home medications include Lisinopril 20mg  daily and Toprol-XL 50mg  daily. - Labetolol and Cardene have been stopped  - BP elevated early this am at 144/12804mmHg but all her other readings in past 24 hours were stable - increasing Toprol XL to 75mg  daily starting today - continue to titrate as needed for HTN  Hemorrhagic Stroke - Management per Neuro.  History of Cardiac Thrombus - Patient reportedly has history of cardiac thrombus found at time of first stroke in 1992. Has been on Coumadin since then. - No LV thrombus noted on Echo this admission. - Given atrial fibrillation and mitral stenosis, will plan to restart Coumadin in about 1 month when cleared by Neuro.      For questions  or updates, please contact [facility] Please consult [email or web address] for contact info under Cardiology/STEMI.      Signed, Armanda Magic, MD  02/26/2019, 8:29 AM

## 2019-02-26 NOTE — Progress Notes (Signed)
Physical Therapy Treatment Patient Details Name: Kimberly Mullins MRN: [id number] DOB: [birth year] Today's Date: 02/26/2019    History of Present Illness Kimberly Mullins is a 85 y.o. female with past medical history significant for hypertension, diabetes mellitus, stroke with mild left-sided residual deficits presents to the emergency department with generalized weakness and difficulty walking since yesterday night. Head CT which revealed a small hemorrhage in the genu of the corpus callosum bilaterally with mild localized edema but no mass-effect or intraventricular hemorrhage.      PT Comments    Pt progressing well towards physical therapy goals. Remains very motivated to participate with good activity tolerance. Requiring moderate assist for functional mobility. Ambulating 60 feet with walker and close chair follow. HR stable. Continue to recommend SNF for ongoing Physical Therapy.       Follow Up Recommendations  SNF;Supervision/Assistance - 24 hour     Equipment Recommendations  Wheelchair (measurements PT);3in1 (PT)    Recommendations for Other Services       Precautions / Restrictions Precautions Precautions: Fall Restrictions Weight Bearing Restrictions: No    Mobility  Bed Mobility Overal bed mobility: Needs Assistance Bed Mobility: Sit to Supine       Sit to supine: Mod assist   General bed mobility comments: modA for guiding legs back into bed  Transfers Overall transfer level: Needs assistance Equipment used: Rolling walker (2 wheeled) Transfers: Sit to/from Stand Sit to Stand: Mod assist         General transfer comment: ModA + 2 for power up to standing with initial posterior lean. Pt able to correct with increased time and cueing  Ambulation/Gait Ambulation/Gait assistance: Mod assist;+2 safety/equipment Gait Distance (Feet): 60 Feet Assistive device: Rolling walker (2 wheeled) Gait Pattern/deviations: Step-through pattern;Decreased stride  length;Trunk flexed;Drifts right/left;Decreased step length - left Gait velocity: decreased Gait velocity interpretation: <1.8 ft/sec, indicate of risk for recurrent falls General Gait Details: ModA for stability, +2 for chair follow. Cues for looking up, larger left step length, walker proximity, activity pacing.   Stairs             Wheelchair Mobility    Modified Rankin (Stroke Patients Only) Modified Rankin (Stroke Patients Only) Pre-Morbid Rankin Score: Moderate disability Modified Rankin: Moderately severe disability     Balance Overall balance assessment: Needs assistance Sitting-balance support: Bilateral upper extremity supported;Feet supported Sitting balance-Leahy Scale: Poor     Standing balance support: Bilateral upper extremity supported;Single extremity supported Standing balance-Leahy Scale: Poor                              Cognition Arousal/Alertness: Awake/alert Behavior During Therapy: WFL for tasks assessed/performed Overall Cognitive Status: Within Functional Limits for tasks assessed                                        Exercises General Exercises - Upper Extremity Shoulder Flexion: Both;10 reps;Seated General Exercises - Lower Extremity Long Arc Quad: Both;10 reps;Seated Hip Flexion/Marching: Both;10 reps;Seated    General Comments        Pertinent Vitals/Pain Pain Assessment: Faces Faces Pain Scale: No hurt    Home Living                      Prior Function            PT Goals (current  goals can now be found in the care plan section) Acute Rehab PT Goals Patient Stated Goal: to walk more Potential to Achieve Goals: Fair Progress towards PT goals: Progressing toward goals    Frequency    Min 3X/week      PT Plan Current plan remains appropriate    Co-evaluation              AM-PAC PT "6 Clicks" Mobility   Outcome Measure  Help needed turning from your back to your  side while in a flat bed without using bedrails?: A Little Help needed moving from lying on your back to sitting on the side of a flat bed without using bedrails?: A Lot Help needed moving to and from a bed to a chair (including a wheelchair)?: A Lot Help needed standing up from a chair using your arms (e.g., wheelchair or bedside chair)?: A Lot Help needed to walk in hospital room?: A Lot Help needed climbing 3-5 steps with a railing? : Total 6 Click Score: 12    End of Session Equipment Utilized During Treatment: Gait belt Activity Tolerance: Patient tolerated treatment well Patient left: in bed;with call bell/phone within reach;with bed alarm set;with family/visitor present Nurse Communication: Mobility status PT Visit Diagnosis: Other abnormalities of gait and mobility (R26.89);Muscle weakness (generalized) (M62.81);Difficulty in walking, not elsewhere classified (R26.2);Other symptoms and signs involving the nervous system (R29.898)     Time: 4196-2229 PT Time Calculation (min) (ACUTE ONLY): 33 min  Charges:  $Gait Training: 8-22 mins $Therapeutic Exercise: 8-22 mins                     Laurina Bustle, PT, DPT Acute Rehabilitation Services Pager [phone] Office [phone]    Vanetta Mulders 02/26/2019, 4:32 PM

## 2019-02-26 NOTE — TOC Progression Note (Signed)
Transition of Care [facility]) - Progression Note    Patient Details  Name: EMMAJO BENNETTE MRN: [id number] Date of Birth: [birth year]  Transition of Care [facility]) CM/SW [place], [place] Phone Number: 02/26/2019, 11:19 AM  Clinical Narrative:    CSW spoke with Claiborne Billings at [facility].  They are able to offer placement when pt medical stable.   Pt has contacted Burnetta Sabin with Stroke Team to assess pt stability.    Expected Discharge Plan: [place] Barriers to Discharge: Continued Medical Work up  Expected Discharge Plan and Services Expected Discharge Plan: [place]   Discharge Planning Services: CM Consult Post Acute Care Choice: [place] Living arrangements for the past 2 months: [place]  Readmission Risk Interventions No flowsheet data found.

## 2019-02-26 NOTE — Progress Notes (Signed)
STROKE TEAM PROGRESS NOTE   INTERVAL HISTORY Her son is at the bedside. New AF yesterday, now off cardizem gtt. HR remains elevated in the 90s. Pt states HR chronically elevated. Plan SNF once HR better controlled.     OBJECTIVE Vitals:   02/25/19 2051 02/25/19 2354 02/26/19 0344 02/26/19 0852  BP: 140/70 (!) 140/55 (!) 144/104 (!) 170/73  Pulse:  83 87 88  Resp: 18 17 18 20   Temp: 97.9 F (36.6 C) 98 F (36.7 C) 97.9 F (36.6 C) 98 F (36.7 C)  TempSrc: Oral Oral Oral Axillary  SpO2: 92% 93% 92% 93%  Weight:      Height:        CBC:  Recent Labs  Lab 02/21/19 0333 02/23/19 0633 02/24/19 0506  WBC 11.3* 9.7 10.0  NEUTROABS 7.3  --   --   HGB 12.4 10.0* 10.9*  HCT 37.6 31.0* 33.5*  MCV 89.5 91.4 90.8  PLT 231 165 790    Basic Metabolic Panel:  Recent Labs  Lab 02/24/19 0506 02/25/19 1426  NA 141 134*  K 4.4 4.0  CL 106 100  CO2 24 24  GLUCOSE 104* 146*  BUN 12 20  CREATININE 0.68 1.15*  CALCIUM 9.3 8.9  MG  --  1.5*    Lipid Panel:     Component Value Date/Time   CHOL 155 02/24/2019 0506   TRIG 86 02/24/2019 0506   HDL 60 02/24/2019 0506   CHOLHDL 2.6 02/24/2019 0506   VLDL 17 02/24/2019 0506   LDLCALC 78 02/24/2019 0506   HgbA1c:  Lab Results  Component Value Date   HGBA1C 6.1 (H) 02/21/2019   Urine Drug Screen:     Component Value Date/Time   LABOPIA NONE DETECTED 02/23/2019 1413   COCAINSCRNUR NONE DETECTED 02/23/2019 1413   LABBENZ NONE DETECTED 02/23/2019 1413   AMPHETMU NONE DETECTED 02/23/2019 1413   THCU NONE DETECTED 02/23/2019 1413   LABBARB NONE DETECTED 02/23/2019 1413    Alcohol Level     Component Value Date/Time   ETH <10 02/21/2019 0558    IMAGING  Ct Head Wo Contrast 02/21/2019 1. Focal parenchymal hemorrhage involving the anterior genu of the corpus callosum bilaterally, left greater than right, with posterior extension along the body of the left corpus callosum. Mild localized edema without significant regional  mass effect. Finding of uncertain etiology, with primary differential considerations including an acute hemorrhagic infarct, atypical hypertensive hemorrhage, or coagulopathic bleed. No visible underlying mass lesion or vascular malformation. No other findings to suggest acute traumatic injury.  2. Moderately advanced age-related cerebral atrophy with chronic small vessel ischemic disease and remote right basal ganglia lacunar infarct.   Portable Chest - 1 View 02/21/2019 Potential pneumonia and or effusion in the left lung base.  ECG - SR rate 91 BPM. (See cardiology reading for complete details)   PHYSICAL EXAM     Pleasant frail elderly Caucasian lady not in distress. . Afebrile. Head is nontraumatic. Neck is supple without bruit.    Cardiac exam no murmur or gallop. Lungs are clear to auscultation. Distal pulses are well felt. Neurological Exam : Awake alert oriented x2.  Diminished attention, recall.  No aphasia apraxia or dysarthria.  Extraocular movements are full range without nystagmus.  Blinks to threat bilaterally.  Motor system exam revealed no upper or lower extremity drift with symmetric equal strength in all 4 extremities except for mild weakness of left grip and intrinsic hand muscles.  Orbits right over left upper extremity.  Mild weakness of left hip flexors and ankle dorsiflexors only..  Plantars are downgoing.  Gait not tested.   ASSESSMENT/PLAN Kimberly Mullins is a lovely 85 y.o. female with history of hypertension, diabetes mellitus, previous stroke with mild left-sided residual deficits, and Coumadin PTA (INR 2.0) presents to the emergency department with generalized weakness and difficulty walking. She did not receive IV t-PA due to hemorrhage.  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  Sars Corona Virus 2 - negative  LDL - 78   HgbA1c - 6.1  UDS - negative  VTE prophylaxis - SCDs  warfarin daily prior to admission, now on No antithrombotic given hemorrhage    Therapy recommendations:  SNF  Disposition:  Pending - anticipate she will be medically stable to d/c to [facility] 12/11. Discussed with CSW.  COVID 12/9 negative in preparation for SNF transfer   Atrial Fibrillation w/ RVR  Possible reason for home anticoagulation:  warfarin daily   Last INR 1.4  CHA2DS2-VASc Score =  , ?2 oral anticoagulation recommended  Age in Years:  ?79   +2    Sex:  Female   Female   +1    Hypertension History:  yes   +1     Diabetes Mellitus:  yes   +1  Congestive Heart Failure History:   yes   +1  Vascular Disease History:   yes   +1     Stroke/TIA/Thromboembolism History:   yes   +2 . Converted to NSR following cardizem gtt, now off . TSH normal . Remains tachycardic today - toprol increased to 75 by cardiology . Cardiology on board . Continue aspirin 81 mg daily at discharge . Plan to resume warfarin in 1 month if remains stable - using warfarin d/t AF and hx Rheumatoid fever w/ MV stenosis  . Hope for d/c 12/11 if HR stablizes  Mitral Stenosis Chronic Warfarin use  Warfarin (INR 2.0) -> vitamin K - Repeat Protime 12/5 ->1.4    Pt reports hx cardiac thrombus 01/19/1991 admission to [facility]  Only other documentation since that time is from warfarin clinic at [facility] where they continued warfarin in setting of prior stroke.  Have requested records from [facility] to see if we can review that admission  Not a candidate for [facility] now, likely can resume in 1 month but unsure that is necessary  Hypertensive Emergency  BP 181/102 on arrival to ED  Home BP meds: Zestril 20; Toprol 50 XL daily  Current BP meds: Zestril 30, Toprol 50 XL daily  Was on cardene, now  off  BP Stable 140s . SBP goal < 160 mm Hg   . Long-term BP goal normotensive  Hyperlipidemia  Home Lipid lowering medication: Zocor 40 mg daily  LDL - 78, goal < 70  Current lipid lowering medication: None (statin contraindicated with ICH)  Resume statin at discharge  Diabetes type II  Home diabetic meds: glucophage  Current diabetic meds: SSI  HgbA1c - 6.1, goal < 7.0 Recent Labs    02/25/19 1614 02/25/19 2106 02/26/19 0630  GLUCAP 115* 102* 95   Other Stroke Risk Factors  Advanced age  Hx stroke/TIA -  remote right basal ganglia lacunar infarct (no details available in  Epic)  Other Active Problems  Possible aspiration PNA. Mild Leukocytosis, resolved - 10.0; TMax - 100.3 ; U/A -  Negative - CXR 02/21/19 - Potential pneumonia and or effusion in the left lung base-> Unasyn 12/6>> repeat CXR 12/8 w/ mild pulm edema, atx - will get OOB today  Anemia 10.0   Hypokalemia 3.4->3.2- supplement - 4.4  Hospital day # 5  Patient has developed atrial fibrillation appreciate cardiology help.  Increase metoprolol dose due to increased resting heart rate   Patient has likely valvular atrial fibrillation and may not be a candidate for newer anticoagulants but she clearly had intracerebral hemorrhage on warfarin but given her advanced age she may not be the best candidate for watchman device either.  We will continue her on aspirin 81 mg daily for the time being for now.  May consider possible participation in the aSpire trial of atrial fibrillation patients with intracerebral hemorrhage.  Patient will likely need transfer for rehabilitation to SNF in the next few days when stable.  Long discussion with the patient and her son at the bedside and answered questions.   Greater than 50% time during this 25-minute visit was spent on counseling and coordination of care about her atrial fibrillation and discussion about risk benefit of anticoagulation and stroke and answering  questions.  Delia HeadyPramod Brezlyn Manrique, MD To contact Stroke Continuity provider, please refer to [email or web address]. After hours, contact General Neurology

## 2019-02-27 ENCOUNTER — Other Ambulatory Visit: Payer: Self-pay | Admitting: Student

## 2019-02-27 DIAGNOSIS — R9431 Abnormal electrocardiogram [ECG] [EKG]: Secondary | ICD-10-CM

## 2019-02-27 LAB — BASIC METABOLIC PANEL
Anion gap: 11 (ref 5–15)
BUN: 12 mg/dL (ref 8–23)
CO2: 24 mmol/L (ref 22–32)
Calcium: 8.8 mg/dL — ABNORMAL LOW (ref 8.9–10.3)
Chloride: 105 mmol/L (ref 98–111)
Creatinine, Ser: 0.61 mg/dL (ref 0.44–1.00)
GFR calc Af Amer: >60 mL/min (ref >60)
GFR calc non Af Amer: >60 mL/min (ref >60)
Glucose, Bld: 103 mg/dL — ABNORMAL HIGH (ref 70–99)
Potassium: 3.3 mmol/L — ABNORMAL LOW (ref 3.5–5.1)
Sodium: 140 mmol/L (ref 135–145)

## 2019-02-27 LAB — CBC
HCT: 29.8 % — ABNORMAL LOW (ref 36.0–46.0)
Hemoglobin: 9.6 g/dL — ABNORMAL LOW (ref 12.0–15.0)
MCH: 29.2 pg (ref 26.0–34.0)
MCHC: 32.2 g/dL (ref 30.0–36.0)
MCV: 90.6 fL (ref 80.0–100.0)
Platelets: 239 10*3/uL (ref 150–400)
RBC: 3.29 MIL/uL — ABNORMAL LOW (ref 3.87–5.11)
RDW: 12.9 % (ref 11.5–15.5)
WBC: 7.6 10*3/uL (ref 4.0–10.5)
nRBC: 0 % (ref 0.0–0.2)

## 2019-02-27 LAB — GLUCOSE, CAPILLARY
Glucose-Capillary: 104 mg/dL — ABNORMAL HIGH (ref 70–99)
Glucose-Capillary: 103 mg/dL — ABNORMAL HIGH (ref 70–99)
Glucose-Capillary: 111 mg/dL — ABNORMAL HIGH (ref 70–99)

## 2019-02-27 MED ORDER — OMEGA-3-ACID ETHYL ESTERS 1 G PO CAPS: 1000.0000 mg | ORAL_CAPSULE | Freq: Every day | ORAL | 1 refills | Status: AC

## 2019-02-27 MED ORDER — METOPROLOL SUCCINATE ER 25 MG PO TB24: 75.0000 mg | ORAL_TABLET | Freq: Every day | ORAL | 1 refills | Status: DC

## 2019-02-27 MED ORDER — LISINOPRIL 20 MG PO TABS
30.0000 mg | ORAL_TABLET | Freq: Every day | ORAL | Status: DC
  Administered 2019-02-27: 30 mg via ORAL
  Filled 2019-02-27: qty 1

## 2019-02-27 MED ORDER — VITAMIN D 25 MCG (1000 UNIT) PO TABS: 1000.0000 [IU] | ORAL_TABLET | Freq: Every day | ORAL | 1 refills | Status: AC

## 2019-02-27 MED ORDER — ASPIRIN 81 MG PO TBEC: 81.0000 mg | DELAYED_RELEASE_TABLET | Freq: Every day | ORAL | 1 refills | Status: DC

## 2019-02-27 MED ORDER — LISINOPRIL 30 MG PO TABS: 30.0000 mg | ORAL_TABLET | Freq: Every day | ORAL | 1 refills | Status: DC

## 2019-02-27 MED ORDER — POTASSIUM CHLORIDE ER 10 MEQ PO TBCR: 10.0000 meq | EXTENDED_RELEASE_TABLET | Freq: Two times a day (BID) | ORAL | 0 refills | Status: DC

## 2019-02-27 MED ORDER — LORATADINE 10 MG PO TABS: 10.0000 mg | ORAL_TABLET | Freq: Every day | ORAL | 1 refills | Status: AC

## 2019-02-27 NOTE — Progress Notes (Signed)
Progress Note  Patient Name: Kimberly Mullins Date of Encounter: 02/27/2019  Primary Cardiologist: No primary care provider on file.   Subjective   HR improved in NSR on higher dose of BB.  Denies any CP or SOB  Inpatient Medications    Scheduled Meds: . aspirin EC  81 mg Oral Daily  . Chlorhexidine Gluconate Cloth  6 each Topical Daily  . cholecalciferol  1,000 Units Oral BID  . insulin aspart  0-9 Units Subcutaneous TID WC  . lisinopril  20 mg Oral Daily  . loratadine  10 mg Oral Daily  . metoprolol succinate  75 mg Oral Daily  . omega-3 acid ethyl esters  1,000 mg Oral Daily  . oxybutynin  5 mg Oral BID  . pantoprazole  40 mg Oral QHS  . senna-docusate  1 tablet Oral BID  . vitamin C  250 mg Oral BID   Continuous Infusions: . sodium chloride Stopped (02/25/19 1405)  . ampicillin-sulbactam (UNASYN) IV 3 g (02/27/19 0701)   PRN Meds: sodium chloride, acetaminophen **OR** acetaminophen (TYLENOL) oral liquid 160 mg/5 mL **OR** acetaminophen, calcium carbonate, labetalol   Vital Signs    Vitals:   02/26/19 1610 02/26/19 2018 02/26/19 2353 02/27/19 0359  BP: (!) 159/59 (!) 146/65 (!) 183/69 (!) 144/48  Pulse: 75 70 78 78  Resp: 18 18 17 15   Temp: 98.4 F (36.9 C) 97.7 F (36.5 C) 98.2 F (36.8 C) 98.1 F (36.7 C)  TempSrc: Oral Oral Oral Oral  SpO2: 96% 99% 96% 96%  Weight:      Height:        Intake/Output Summary (Last 24 hours) at 02/27/2019 0703 Last data filed at 02/27/2019 0234 Gross per 24 hour  Intake --  Output 800 ml  Net -800 ml   Filed Weights   02/21/19 0445 02/24/19 2151  Weight: 68.7 kg 74 kg    Telemetry    NSR in the 70-80's- Personally Reviewed  ECG  No new EKG to review - Personally Reviewed  Physical Exam   \GEN: Well nourished, well developed in no acute distress HEENT: Normal NECK: No JVD; No carotid bruits LYMPHATICS: No lymphadenopathy CARDIAC:RRR, no murmurs, rubs, gallops RESPIRATORY:  Clear to auscultation  without rales, wheezing or rhonchi  ABDOMEN: Soft, non-tender, non-distended MUSCULOSKELETAL:  No edema; No deformity  SKIN: Warm and dry NEUROLOGIC:  Alert and oriented x 3 PSYCHIATRIC:  Normal affect    Labs    Chemistry Recent Labs  Lab 02/24/19 0506 02/25/19 1426 02/27/19 0208  NA 141 134* 140  K 4.4 4.0 3.3*  CL 106 100 105  CO2 24 24 24   GLUCOSE 104* 146* 103*  BUN 12 20 12   CREATININE 0.68 1.15* 0.61  CALCIUM 9.3 8.9 8.8*  GFRNONAA >60 43* >60  GFRAA >60 50* >60  ANIONGAP 11 10 11      Hematology Recent Labs  Lab 02/23/19 0633 02/24/19 0506 02/27/19 0208  WBC 9.7 10.0 7.6  RBC 3.39* 3.69* 3.29*  HGB 10.0* 10.9* 9.6*  HCT 31.0* 33.5* 29.8*  MCV 91.4 90.8 90.6  MCH 29.5 29.5 29.2  MCHC 32.3 32.5 32.2  RDW 13.2 13.2 12.9  PLT 165 185 239    Cardiac EnzymesNo results for input(s): TROPONINI in the last 168 hours. No results for input(s): TROPIPOC in the last 168 hours.   BNPNo results for input(s): BNP, PROBNP in the last 168 hours.   DDimer No results for input(s): DDIMER in the last 168 hours.  Radiology    No results found.  Cardiac Studies   2D echo 02/2019 IMPRESSIONS    1. Left ventricular ejection fraction, by visual estimation, is 55 to 60%. The left ventricle has normal function. There is no left ventricular hypertrophy.  2. The left ventricle has no regional wall motion abnormalities.  3. Global right ventricle has normal systolic function.The right ventricular size is normal. No increase in right ventricular wall thickness.  4. Left atrial size was mildly dilated.  5. Right atrial size was normal.  6. Moderate calcification of the mitral valve leaflet(s).  7. Moderate thickening of the mitral valve leaflet(s).  8. Severe mitral annular calcification.  9. The mitral valve is abnormal. Mild mitral valve regurgitation. Mild-moderate mitral stenosis. 10. Mitral stenosis mild by pressure half time/mitral valve area, moderate by  gradient. No prior for comparison. 11. The tricuspid valve is normal in structure. Tricuspid valve regurgitation is trivial. 12. The aortic valve was not well visualized. Aortic valve regurgitation is trivial. Mild aortic valve stenosis. 13. The pulmonic valve was not well visualized. Pulmonic valve regurgitation is not visualized. 14. The inferior vena cava is dilated in size with >50% respiratory variability, suggesting right atrial pressure of 8 mmHg. 15. The atrial septum is grossly normal.  Patient Profile     85 y.o. female with a history of stroke with mild left-sided residual deficits, hypertension, diabetes mellitus, and rheumatic fever as a child who is being seen today for the evaluation of mitral stenosis and atrial fibrillation with RVR at the request of Dr. Pearlean Brownie.  Assessment & Plan    New Onset Atrial Fibrillation - Patient went into atrial fibrillation with RVR while hospitalized but converted back to normal sinus rhythm after being started on Cardizem drip. No known history of atrial fibrillation. - She continues to remain in NSR - Echo showed normal EF and wall motion. - K+ 3.3 today - TSH normal at 1.297 - Cardizem gtt stopped - increasedToprol to 75mg  daily due to sinus tach yesterday and her HR is much improved in the 70-80's - CHA2DS2-VASc = 7 (HTN, DM, stroke x2, age x2, gender). Cannot be anticoagulated at this time due to hemorrhagic stroke. However, would plan to restart Coumadin when OK per Neurology. Per Neurology notes, will need to be off Coumadin for 1 month.  Chest Pain - Patient reports history of chest pain solely with meals. Non-exertional. Currently chest pain free. - EKG today did show diffuse ST depressions while in atrial fibrillation with RVR. - 2D echo with normal LVF - recommend outpt nuclear stress test  Mitral Stenosis - Likely Rheumatic - Echo showed mild to moderate mitral stenosis with severe mitral annular calcification.  - Patient does  have history of rheumatic fever on exam.  Hypertension - BP as high as 181/102 on admission and patient on Labetalol and Cardene drip. - Home medications include Lisinopril 20mg  daily and Toprol-XL 50mg  daily. - Labetolol and Cardene have been stopped  - BP elevated early this am at 144/62mmHg but elevated higher during the day yesterday - continue Toprol XL to 75mg  daily  - increase Lisinopril to 30mg  daily  Hemorrhagic Stroke - Management per Neuro.  History of Cardiac Thrombus - Patient reportedly has history of cardiac thrombus found at time of first stroke in 1992. Has been on Coumadin since then. - No LV thrombus noted on Echo this admission. - Given atrial fibrillation and mitral stenosis, will plan to restart Coumadin in about 1 month when cleared by  Neuro.      For questions or updates, please contact [place] Please consult [email or web address] for contact info under Cardiology/STEMI.      Signed, Fransico Him, MD  02/27/2019, 7:03 AM

## 2019-02-27 NOTE — Progress Notes (Signed)
Pt son at bedside updated and RN answered and addressed all his concerns and questions and he voices understanding and satisfaction. Pt picked up by [facility] to be transported off unit to disposition. Pt belongings sent home with son. Delia Heady RN

## 2019-02-27 NOTE — Progress Notes (Signed)
Ordered Lexiscan Myoview for further evaluation of ST depression noted on EKG while patient was in atrial fibrillation with RVR.

## 2019-02-27 NOTE — Progress Notes (Signed)
STROKE TEAM PROGRESS NOTE   INTERVAL HISTORY Her son is at the bedside.HR  now better in the 70s. Pt states HR chronically elevated. Plan SNF after insurance approval and when bed available.     OBJECTIVE Vitals:   02/26/19 1610 02/26/19 2018 02/26/19 2353 02/27/19 0359  BP: (!) 159/59 (!) 146/65 (!) 183/69 (!) 144/48  Pulse: 75 70 78 78  Resp: 18 18 17 15   Temp: 98.4 F (36.9 C) 97.7 F (36.5 C) 98.2 F (36.8 C) 98.1 F (36.7 C)  TempSrc: Oral Oral Oral Oral  SpO2: 96% 99% 96% 96%  Weight:      Height:        CBC:  Recent Labs  Lab 02/21/19 0333 02/24/19 0506 02/27/19 0208  WBC 11.3* 10.0 7.6  NEUTROABS 7.3  --   --   HGB 12.4 10.9* 9.6*  HCT 37.6 33.5* 29.8*  MCV 89.5 90.8 90.6  PLT 231 185 239    Basic Metabolic Panel:  Recent Labs  Lab 02/25/19 1426 02/27/19 0208  NA 134* 140  K 4.0 3.3*  CL 100 105  CO2 24 24  GLUCOSE 146* 103*  BUN 20 12  CREATININE 1.15* 0.61  CALCIUM 8.9 8.8*  MG 1.5*  --     Lipid Panel:     Component Value Date/Time   CHOL 155 02/24/2019 0506   TRIG 86 02/24/2019 0506   HDL 60 02/24/2019 0506   CHOLHDL 2.6 02/24/2019 0506   VLDL 17 02/24/2019 0506   LDLCALC 78 02/24/2019 0506   HgbA1c:  Lab Results  Component Value Date   HGBA1C 6.1 (H) 02/21/2019   Urine Drug Screen:     Component Value Date/Time   LABOPIA NONE DETECTED 02/23/2019 1413   COCAINSCRNUR NONE DETECTED 02/23/2019 1413   LABBENZ NONE DETECTED 02/23/2019 1413   AMPHETMU NONE DETECTED 02/23/2019 1413   THCU NONE DETECTED 02/23/2019 1413   LABBARB NONE DETECTED 02/23/2019 1413    Alcohol Level     Component Value Date/Time   ETH <10 02/21/2019 0558    IMAGING  Ct Head Wo Contrast 02/21/2019 1. Focal parenchymal hemorrhage involving the anterior genu of the corpus callosum bilaterally, left greater than right, with posterior extension along the body of the left corpus callosum. Mild localized edema without significant regional mass effect. Finding  of uncertain etiology, with primary differential considerations including an acute hemorrhagic infarct, atypical hypertensive hemorrhage, or coagulopathic bleed. No visible underlying mass lesion or vascular malformation. No other findings to suggest acute traumatic injury.  2. Moderately advanced age-related cerebral atrophy with chronic small vessel ischemic disease and remote right basal ganglia lacunar infarct.   Portable Chest - 1 View 02/21/2019 Potential pneumonia and or effusion in the left lung base.  ECG - SR rate 91 BPM. (See cardiology reading for complete details)   PHYSICAL EXAM     Pleasant frail elderly Caucasian lady not in distress. . Afebrile. Head is nontraumatic. Neck is supple without bruit.    Cardiac exam no murmur or gallop. Lungs are clear to auscultation. Distal pulses are well felt. Neurological Exam : Awake alert oriented x2.  Diminished attention, recall.  No aphasia apraxia or dysarthria.  Extraocular movements are full range without nystagmus.  Blinks to threat bilaterally.  Motor system exam revealed no upper or lower extremity drift with symmetric equal strength in all 4 extremities except for mild weakness of left grip and intrinsic hand muscles.  Orbits right over left upper extremity.  Mild weakness  of left hip flexors and ankle dorsiflexors only..  Plantars are downgoing.  Gait not tested.   ASSESSMENT/PLAN Ms. EVELLYN TUFF is a lovely 85 y.o. female with history of hypertension, diabetes mellitus, previous stroke with mild left-sided residual deficits, and Coumadin PTA (INR 2.0) presents to the emergency department with generalized weakness and difficulty walking. She did not receive IV t-PA due to hemorrhage.  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  Sars Corona Virus 2 - negative  LDL - 78   HgbA1c - 6.1  UDS - negative  VTE prophylaxis - SCDs  warfarin daily prior to admission, now on No antithrombotic given hemorrhage    Therapy recommendations:  SNF  Disposition:  Pending - anticipate she will be medically stable to d/c to [facility] 12/11. Discussed with CSW.  COVID 12/9 negative in preparation for SNF transfer   Atrial Fibrillation w/ RVR  Possible reason for home anticoagulation:  warfarin daily   Last INR 1.4  CHA2DS2-VASc Score =  , ?2 oral anticoagulation recommended  Age in Years:  ?60   +2    Sex:  Female   Female   +1    Hypertension History:  yes   +1     Diabetes Mellitus:  yes   +1  Congestive Heart Failure History:   yes   +1  Vascular Disease History:   yes   +1     Stroke/TIA/Thromboembolism History:   yes   +2 . Converted to NSR following cardizem gtt, now off . TSH normal . Remains tachycardic today - toprol increased to 75 by cardiology . Cardiology on board . Continue aspirin 81 mg daily at discharge . Plan to resume warfarin in 1 month if remains stable - using warfarin d/t AF and hx Rheumatoid fever w/ MV stenosis  . Hope for d/c 12/11 if HR stablizes  Mitral Stenosis Chronic Warfarin use  Warfarin (INR 2.0) -> vitamin K - Repeat Protime 12/5 ->1.4    Pt reports hx cardiac thrombus 01/19/1991 admission to [facility] ([facility]  Only other documentation since that time is from warfarin clinic at [facility] where they continued warfarin in setting of prior stroke.  Have requested records from [facility] to see if we can review that admission  Not a candidate for [facility] now, likely can resume in 1 month but unsure that is necessary  Hypertensive Emergency  BP 181/102 on arrival to ED  Home BP meds: Zestril 20; Toprol 50 XL daily  Current BP meds: Zestril 30, Toprol 50 XL daily  Was on cardene, now off  BP Stable  140s . SBP goal < 160 mm Hg   . Long-term BP goal normotensive  Hyperlipidemia  Home Lipid lowering medication: Zocor 40 mg daily  LDL - 78, goal < 70  Current lipid lowering medication: None (statin contraindicated with ICH)  Resume statin at discharge  Diabetes type II  Home diabetic meds: glucophage  Current diabetic meds: SSI  HgbA1c - 6.1, goal < 7.0 Recent Labs    02/26/19 1209 02/26/19 1842 02/26/19 2119  GLUCAP 106* 137* 111*   Other Stroke Risk Factors  Advanced age  Hx stroke/TIA -  remote right basal ganglia lacunar infarct (no details available in Epic)  Other Active Problems  Possible aspiration PNA. Mild Leukocytosis, resolved - 10.0; TMax - 100.3 ; U/A -  Negative - CXR 02/21/19 - Potential pneumonia and or effusion in the left lung base-> Unasyn 12/6>> repeat CXR 12/8 w/ mild pulm edema, atx - will get OOB today  Anemia 10.0   Hypokalemia 3.4->3.2- supplement - 4.4  Hospital day # 6  Patient's heart rate appears much better controlled today after increasing the dose of metoprolol to 75 mg daily.  Cardiology plans on doing a Myoview scan to evaluate for cardiac ischemia.  We will continue her on aspirin 81 mg daily for the time being for now.  May consider possible participation in the ASpire trial of atrial fibrillation patients with intracerebral hemorrhage versus restarting warfarin after 4 to 6 weeks.  Patient will likely need transfer for rehabilitation to SNF in the next few days when stable.  Long discussion with the patient and her son at the bedside and answered questions.   Greater than 50% time during this 25-minute visit was spent on counseling and coordination of care about her atrial fibrillation and discussion about risk benefit of anticoagulation and stroke and answering questions. Delia Heady, MD  To contact Stroke Continuity provider, please refer to [email or web address]. After hours, contact General Neurology

## 2019-02-27 NOTE — TOC Progression Note (Signed)
Transition of Care [facility]) - Progression Note    Patient Details  Name: Kimberly Mullins MRN: [id number] Date of Birth: 21-Jan-1932  Transition of Care [facility]) CM/SW [place], [place] Phone Number: 02/27/2019, 1:26 PM  Clinical Narrative:    CSW spoke with Claiborne Billings at [facility].  They have a bed, following for pt medical stability and they have spoken with family.    Expected Discharge Plan: [place] Barriers to Discharge: Continued Medical Work up  Expected Discharge Plan and Services Expected Discharge Plan: [place]   Discharge Planning Services: CM Consult Post Acute Care Choice: [place] Living arrangements for the past 2 months: [place]  Readmission Risk Interventions No flowsheet data found.

## 2019-02-27 NOTE — TOC Transition Note (Signed)
Transition of Care [facility] - CM/SW Discharge Note   Patient Details  Name: [name] MRN: [id number] Date of Birth: 09/20/1931  Transition of Care [facility]) CM/SW Contact:  Alexander Mt, [place] Phone Number: 02/27/2019, 2:29 PM   Clinical Narrative:    CSW aware pt cleared for discharge, CSW contacted Claiborne Billings at [place] they are following for dc summary.   Will arrange PTAR when all paperwork complete.   Final next level of care: Skilled Nursing Facility Barriers to Discharge: Barriers Resolved   Patient Goals and CMS Choice Patient states their goals for this hospitalization and ongoing recovery are:: to go to SNF CMS Medicare.gov Compare Post Acute Care list provided to:: Patient Choice offered to / list presented to : Patient, [facility] POA / Guardian, Adult Children  Discharge Placement PASRR number recieved: 02/23/19            Patient chooses bed at: [facility] Patient to be transferred to facility by: [facility] Name of family member notified: pt son Karn Pickler spoke with RNCM Haze Justin Patient and family notified of of transfer: 02/27/19  Discharge Plan and Services Discharge Planning Services: CM Consult Post Acute Care Choice: [place]             Readmission Risk Interventions No flowsheet data found.

## 2019-02-27 NOTE — Progress Notes (Signed)
Pt discharge education and instructions completed. Pt IV's and telemetry removed; pt discharge to [facility] and report called off to nurse Charlsie Merles at the facility. Awaiting on PTAR to pick to transport off to disposition. Delia Heady RN

## 2019-02-27 NOTE — Discharge Summary (Addendum)
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 bilaterally in setting of warfarin use / coagulopathy   Active Problems: ICH (intracerebral hemorrhage) (HCC) Atrial fibrillation with RVR (HCC) - warfarin discontinued Hypertensive urgency Chest pain of uncertain etiology - cardiology F/U - outpatient Myoview scan Diabetes mellitus Anemia - likely chronic Hypokalemia - supplemented Mitral stenosis with h/o rheumatic fever   Past Medical History:  Diagnosis Date   Diabetes mellitus without complication (HCC)    Hypertension    Stroke [facility])    Past Surgical History:  Procedure Laterality Date   APPENDECTOMY      Family History Family History  Problem Relation Age of Onset   CAD Father        died at age 34   Atrial fibrillation Neg Hx     Social History  reports that she has never smoked. She has never used smokeless tobacco. She reports that she does not drink alcohol or use drugs.  Allergies as of 02/27/2019       Reactions   Erythromycin Hives        Medication List     STOP taking these medications    cetirizine 10 MG tablet Commonly known as: ZYRTEC Replaced by: loratadine 10 MG tablet   fenofibrate micronized 134 MG capsule Commonly known as: LOFIBRA   Loperamide A-D 2 MG tablet Generic drug: loperamide   metFORMIN 500 MG tablet Commonly known as: GLUCOPHAGE   Omega-3 1000 MG Caps Replaced by: omega-3 acid ethyl esters 1 g capsule   oxybutynin 5 MG tablet Commonly known as: DITROPAN   Simethicone 80 MG Tabs   vitamin C 250 MG tablet Commonly known as: ASCORBIC ACID   warfarin 5 MG tablet Commonly  known as: COUMADIN       TAKE these medications    acetaminophen 325 MG tablet Commonly known as: TYLENOL Take 650 mg by mouth every 6 (six) hours as needed for mild pain, fever or headache.   aspirin 81 MG EC tablet Take 1 tablet (81 mg total) by mouth daily.   calcium carbonate 500 MG chewable tablet Commonly known as: TUMS - dosed in mg elemental calcium Chew 1 tablet by mouth 3 (three) times daily as needed for indigestion or heartburn.   cholecalciferol 25 MCG (1000 UT) tablet Commonly known as: VITAMIN D3 Take 1 tablet (1,000 Units total) by mouth daily after breakfast. What changed: when to take this   lisinopril 30 MG tablet Commonly known as: ZESTRIL Take 1 tablet (30 mg total) by mouth daily. Start taking on: February 28, 2019 What changed:  medication strength how much to take   loratadine 10 MG tablet Commonly known as: CLARITIN Take 1 tablet (10 mg total) by mouth daily. Start taking on: February 28, 2019 Replaces: cetirizine 10 MG tablet   metoprolol succinate 25 MG 24 hr tablet Commonly known as: TOPROL-XL Take 3 tablets (75 mg total) by mouth daily. Start taking on: February 28, 2019 What changed:  medication strength how much to take additional instructions   omega-3 acid ethyl esters 1 g capsule Commonly known as: LOVAZA Take 1 capsule (1,000 mg total)  by mouth daily. Start taking on: February 28, 2019 Replaces: Omega-3 1000 MG Caps   potassium chloride 10 MEQ tablet Commonly known as: KLOR-CON Take 1 tablet (10 mEq total) by mouth 2 (two) times daily for 6 days.   simvastatin 40 MG tablet Commonly known as: ZOCOR Take 40 mg by mouth at bedtime.        HOME MEDICATIONS PRIOR TO ADMISSION Medications Prior to Admission  Medication Sig Dispense Refill   acetaminophen (TYLENOL) 325 MG tablet Take 650 mg by mouth every 6 (six) hours as needed for mild pain, fever or headache.     calcium carbonate (TUMS - DOSED IN MG ELEMENTAL CALCIUM)  500 MG chewable tablet Chew 1 tablet by mouth 3 (three) times daily as needed for indigestion or heartburn.     cetirizine (ZYRTEC) 10 MG tablet Take 10 mg by mouth daily.     fenofibrate micronized (LOFIBRA) 134 MG capsule Take 134 mg by mouth daily before breakfast.     lisinopril (ZESTRIL) 20 MG tablet Take 20 mg by mouth daily.     loperamide (LOPERAMIDE A-D) 2 MG tablet Take 2 mg by mouth 4 (four) times daily as needed for diarrhea or loose stools.     metFORMIN (GLUCOPHAGE) 500 MG tablet Take 500 mg by mouth 2 (two) times daily with a meal.     metoprolol succinate (TOPROL-XL) 50 MG 24 hr tablet Take 50 mg by mouth daily. Take with or immediately following a meal.     Omega-3 1000 MG CAPS Take 1,000 mg by mouth daily.      oxybutynin (DITROPAN) 5 MG tablet Take 5 mg by mouth 2 (two) times daily.     Simethicone 80 MG TABS Take 2-4 tablets by mouth daily as needed (gas).     simvastatin (ZOCOR) 40 MG tablet Take 40 mg by mouth at bedtime.      vitamin C (ASCORBIC ACID) 250 MG tablet Take 250 mg by mouth 2 (two) times daily.     warfarin (COUMADIN) 5 MG tablet Take 2.5-5 mg by mouth See admin instructions. Take 5 mg by mouth at bedtime on Sun/Mon/Wed/Fri/Sat and 2.5 mg on Tues/Thurs     [DISCONTINUED] cholecalciferol (VITAMIN D3) 25 MCG (1000 UT) tablet Take 1,000 Units by mouth 2 (two) times daily.        HOSPITAL MEDICATIONS  aspirin EC  81 mg Oral Daily   Chlorhexidine Gluconate Cloth  6 each Topical Daily   cholecalciferol  1,000 Units Oral BID   insulin aspart  0-9 Units Subcutaneous TID WC   lisinopril  30 mg Oral Daily   loratadine  10 mg Oral Daily   metoprolol succinate  75 mg Oral Daily   omega-3 acid ethyl esters  1,000 mg Oral Daily   oxybutynin  5 mg Oral BID   pantoprazole  40 mg Oral QHS   senna-docusate  1 tablet Oral BID   vitamin C  250 mg Oral BID    LABORATORY STUDIES CBC    Component Value Date/Time   WBC 7.6 02/27/2019 0208   RBC 3.29 (L) 02/27/2019 0208    HGB 9.6 (L) 02/27/2019 0208   HCT 29.8 (L) 02/27/2019 0208   PLT 239 02/27/2019 0208   MCV 90.6 02/27/2019 0208   MCH 29.2 02/27/2019 0208   MCHC 32.2 02/27/2019 0208   RDW 12.9 02/27/2019 0208   LYMPHSABS 2.7 02/21/2019 0333   MONOABS 1.2 (H) 02/21/2019 0333   EOSABS 0.0 02/21/2019 1610  BASOSABS 0.0 02/21/2019 0333   CMP    Component Value Date/Time   NA 140 02/27/2019 0208   K 3.3 (L) 02/27/2019 0208   CL 105 02/27/2019 0208   CO2 24 02/27/2019 0208   GLUCOSE 103 (H) 02/27/2019 0208   BUN 12 02/27/2019 0208   CREATININE 0.61 02/27/2019 0208   CALCIUM 8.8 (L) 02/27/2019 0208   GFRNONAA >60 02/27/2019 0208   GFRAA >60 02/27/2019 0208   COAGS Lab Results  Component Value Date   INR 1.4 (H) 02/21/2019   INR 2.0 (H) 02/21/2019   Lipid Panel    Component Value Date/Time   CHOL 155 02/24/2019 0506   TRIG 86 02/24/2019 0506   HDL 60 02/24/2019 0506   CHOLHDL 2.6 02/24/2019 0506   VLDL 17 02/24/2019 0506   LDLCALC 78 02/24/2019 0506   HgbA1C  Lab Results  Component Value Date   HGBA1C 6.1 (H) 02/21/2019   Urinalysis    Component Value Date/Time   COLORURINE YELLOW 02/23/2019 1413   APPEARANCEUR CLEAR 02/23/2019 1413   LABSPEC 1.025 02/23/2019 1413   PHURINE 5.0 02/23/2019 1413   GLUCOSEU NEGATIVE 02/23/2019 1413   HGBUR NEGATIVE 02/23/2019 1413   BILIRUBINUR NEGATIVE 02/23/2019 1413   KETONESUR NEGATIVE 02/23/2019 1413   PROTEINUR 30 (A) 02/23/2019 1413   NITRITE NEGATIVE 02/23/2019 1413   LEUKOCYTESUR NEGATIVE 02/23/2019 1413   Urine Drug Screen     Component Value Date/Time   LABOPIA NONE DETECTED 02/23/2019 1413   COCAINSCRNUR NONE DETECTED 02/23/2019 1413   LABBENZ NONE DETECTED 02/23/2019 1413   AMPHETMU NONE DETECTED 02/23/2019 1413   THCU NONE DETECTED 02/23/2019 1413   LABBARB NONE DETECTED 02/23/2019 1413    Alcohol Level    Component Value Date/Time   ETH <10 02/21/2019 0558     SIGNIFICANT DIAGNOSTIC STUDIES  CT Head Wo  Contrast  Result Date: 02/21/2019 CLINICAL DATA:  Initial evaluation for acute bilateral leg weakness. EXAM: CT HEAD WITHOUT CONTRAST TECHNIQUE: Contiguous axial images were obtained from the base of the skull through the vertex without intravenous contrast. COMPARISON:  Prior head CT from 10/25/2018. FINDINGS: Brain: Moderately advanced age-related cerebral atrophy with chronic small vessel ischemic disease. Remote lacunar infarct present at the anterior right basal ganglia. Focal parenchymal hemorrhage seen involving the anterior genu of the corpus callosum bilaterally, left greater than right. Hemorrhage measures approximately 2.7 x 1.2 cm in greatest dimensions. Mild posterior extension along the body of the left corpus callosum (series 6, image 38). Mild localized edema without significant regional mass effect. No appreciable intraventricular extension. No other acute intracranial hemorrhage. No other acute large vessel territory infarct. No mass lesion or midline shift. No hydrocephalus. No extra-axial fluid collection. Vascular: No hyperdense vessel. Scattered vascular calcifications noted within the carotid siphons. Skull: Scalp soft tissues and calvarium within normal limits. Sinuses/Orbits: Globes orbital soft tissues within normal limits. Paranasal sinuses and mastoid air cells are clear. Other: None. IMPRESSION: 1. Focal parenchymal hemorrhage involving the anterior genu of the corpus callosum bilaterally, left greater than right, with posterior extension along the body of the left corpus callosum. Mild localized edema without significant regional mass effect. Finding of uncertain etiology, with primary differential considerations including an acute hemorrhagic infarct, atypical hypertensive hemorrhage, or coagulopathic bleed. No visible underlying mass lesion or vascular malformation. No other findings to suggest acute traumatic injury. 2. Moderately advanced age-related cerebral atrophy with chronic  small vessel ischemic disease and remote right basal ganglia lacunar infarct. Critical Value/emergent results were called  by telephone at the time of interpretation on 02/21/2019 at 1:24 am to [facility] , who verbally acknowledged these results. Electronically Signed   By: Rise Mu M.D.   On: 02/21/2019 01:26   DG CHEST PORT 1 VIEW  Result Date: 02/24/2019 CLINICAL DATA:  Pneumonia EXAM: PORTABLE CHEST 1 VIEW COMPARISON:  Three days ago FINDINGS: Artifact from EKG leads. Blunting at the lateral left costophrenic sulcus that is unchanged. Normal heart size when accounting for rotation. Large lung volumes with diaphragm flattening. Mild interstitial with a few Kerley lines. IMPRESSION: 1. Stable mild opacification at the left base favoring pleural fluid. 2. A few Kerley lines are now noted, question developing edema. Electronically Signed   By: Marnee Spring M.D.   On: 02/24/2019 05:41   DG CHEST PORT 1 VIEW  Result Date: 02/21/2019 CLINICAL DATA:  Fever EXAM: PORTABLE CHEST 1 VIEW COMPARISON:  None FINDINGS: Cardiomediastinal contours are normal accounting for portable technique. Signs of aortic atherosclerosis in the thoracic aorta. Added density at the left lung base with suggestion of blunting of left costophrenic angle. Right chest is clear. No acute bone finding. Signs of prior trauma to the right hemithorax with old-appearing right-sided rib fractures involving right fourth and fifth ribs. IMPRESSION: Potential pneumonia and or effusion in the left lung base. Electronically Signed   By: Donzetta Kohut M.D.   On: 02/21/2019 11:35   ECHOCARDIOGRAM COMPLETE  Result Date: 02/23/2019   ECHOCARDIOGRAM REPORT   Patient Name:   SAYDE LISH Date of Exam: 02/23/2019 Medical Rec #:  [id number]        Height:       63.0 in Accession #:    [id number]       Weight:       151.5 lb Date of Birth:  Oct 28, 1931        BSA:          1.72 m Patient Age:    86 years         BP:           125/54  mmHg Patient Gender: F                HR:           78 bpm. Exam Location:  Inpatient Procedure: 2D Echo Indications:    Stroke I63.9  History:        Patient has no prior history of Echocardiogram examinations.                 Risk Factors:Hypertension and Diabetes.  Sonographer:    Thurman Coyer RDCS (AE) Referring Phys: [id number] SHARON L BIBY IMPRESSIONS  1. Left ventricular ejection fraction, by visual estimation, is 55 to 60%. The left ventricle has normal function. There is no left ventricular hypertrophy.  2. The left ventricle has no regional wall motion abnormalities.  3. Global right ventricle has normal systolic function.The right ventricular size is normal. No increase in right ventricular wall thickness.  4. Left atrial size was mildly dilated.  5. Right atrial size was normal.  6. Moderate calcification of the mitral valve leaflet(s).  7. Moderate thickening of the mitral valve leaflet(s).  8. Severe mitral annular calcification.  9. The mitral valve is abnormal. Mild mitral valve regurgitation. Mild-moderate mitral stenosis. 10. Mitral stenosis mild by pressure half time/mitral valve area, moderate by gradient. No prior for comparison. 11. The tricuspid valve is normal in structure. Tricuspid valve regurgitation is trivial. 12. The aortic valve  was not well visualized. Aortic valve regurgitation is trivial. Mild aortic valve stenosis. 13. The pulmonic valve was not well visualized. Pulmonic valve regurgitation is not visualized. 14. The inferior vena cava is dilated in size with >50% respiratory variability, suggesting right atrial pressure of 8 mmHg. 15. The atrial septum is grossly normal. FINDINGS  Left Ventricle: Left ventricular ejection fraction, by visual estimation, is 55 to 60%. The left ventricle has normal function. The left ventricle has no regional wall motion abnormalities. There is no left ventricular hypertrophy. Right Ventricle: The right ventricular size is normal. No increase in  right ventricular wall thickness. Global RV systolic function is has normal systolic function. Left [facility]: Left atrial size was mildly dilated. Right [facility]: Right atrial size was normal in size Pericardium: There is no evidence of pericardial effusion. Mitral Valve: The mitral valve is abnormal. There is moderate thickening of the mitral valve leaflet(s). There is moderate calcification of the mitral valve leaflet(s). Severe mitral annular calcification. Mild-moderate mitral valve stenosis by observation. MV peak gradient, 20.8 mmHg. Mild mitral valve regurgitation. Mitral stenosis mild by pressure half time/mitral valve area, moderate by gradient. No prior for comparison. Tricuspid Valve: The tricuspid valve is normal in structure. Tricuspid valve regurgitation is trivial. Aortic Valve: The aortic valve was not well visualized. . There is mild thickening and moderate calcification of the aortic valve. Aortic valve regurgitation is trivial. Mild aortic stenosis is present. There is mild thickening of the aortic valve. There  is moderate calcification of the aortic valve. Aortic valve mean gradient measures 15.5 mmHg. Aortic valve peak gradient measures 29.3 mmHg. Aortic valve area, by VTI measures 1.20 cm. Pulmonic Valve: The pulmonic valve was not well visualized. Pulmonic valve regurgitation is not visualized. Aorta: The aortic root and ascending aorta are structurally normal, with no evidence of dilitation. Pulmonary Artery: The pulmonary artery is not well seen. Venous: The inferior vena cava is dilated in size with greater than 50% respiratory variability, suggesting right atrial pressure of 8 mmHg. IAS/Shunts: The atrial septum is grossly normal.  LEFT VENTRICLE PLAX 2D LVIDd:         2.87 cm  Diastology LVIDs:         2.01 cm  LV e' lateral:   4.03 cm/s LV PW:         1.12 cm  LV E/e' lateral: 38.0 LV IVS:        1.12 cm  LV e' medial:    4.35 cm/s LVOT diam:     1.60 cm  LV E/e' medial:  35.2 LV SV:          19 ml LV SV Index:   10.49 LVOT Area:     2.01 cm  RIGHT VENTRICLE RV S prime:     11.90 cm/s TAPSE (M-mode): 1.6 cm LEFT ATRIUM             Index       RIGHT ATRIUM          Index LA diam:        3.80 cm 2.21 cm/m  RA Area:     8.71 cm LA Vol (A2C):   50.9 ml 29.62 ml/m RA Volume:   14.10 ml 8.21 ml/m LA Vol (A4C):   54.0 ml 31.43 ml/m LA Biplane Vol: 53.6 ml 31.20 ml/m  AORTIC VALVE AV Area (Vmax):    1.30 cm AV Area (Vmean):   1.41 cm AV Area (VTI):     1.20 cm AV Vmax:  270.50 cm/s AV Vmean:          183.000 cm/s AV VTI:            0.573 m AV Peak Grad:      29.3 mmHg AV Mean Grad:      15.5 mmHg LVOT Vmax:         175.00 cm/s LVOT Vmean:        128.000 cm/s LVOT VTI:          0.343 m LVOT/AV VTI ratio: 0.60  AORTA Ao Root diam: 2.10 cm MITRAL VALVE MV Area (PHT): 1.96 cm              SHUNTS MV Peak grad:  20.8 mmHg             Systemic VTI:  0.34 m MV Mean grad:  10.0 mmHg             Systemic Diam: 1.60 cm MV Vmax:       2.28 m/s MV Vmean:      154.0 cm/s MV VTI:        0.72 m MV PHT:        112.23 msec MV Decel Time: 387 msec MV E velocity: 153.00 cm/s 103 cm/s MV A velocity: 188.00 cm/s 70.3 cm/s MV E/A ratio:  0.81        1.5  Jodelle Red MD Electronically signed by Jodelle Red MD Signature Date/Time: 02/23/2019/5:29:29 PM    Final       HISTORY OF PRESENT ILLNESS (From Consult Dr Aroor 02/21/2019) YERALDI FIDLER is a 85 y.o. female with past medical history significant for hypertension, diabetes mellitus, stroke with mild left-sided residual deficits presents to the emergency department with generalized weakness and difficulty walking since yesterday night.  Patient presented to the emergency department today afternoon. After long wait in the ER, patient underwent head CT which revealed a small hemorrhage in the genu of the corpus callosum bilaterally with mild localized edema but no mass-effect or intraventricular hemorrhage.  Blood pressure was noted to be  greater than 180 in the ER and patient was given labetalol and started on Cardene drip.  Her PT/INR was checked and is pending.  Given patient has been relatively stable without any worsening symptoms since the last 24 hours we will hold off reversal with Kcentra as at this time risk of thrombotic events outweighs benefit. Date last known well: 12.3.20 tPA Given: No, hemorrhage NIHSS: 2 Baseline MRS 1  HOSPITAL COURSE Ms. LYRAH BRADT is a lovely 85 y.o. female with history of hypertension, diabetes mellitus, previous stroke with mild left-sided residual deficits, and Coumadin PTA (INR 2.0) presents to the emergency department with generalized weakness and difficulty walking. She did not receive IV t-PA due to hemorrhage.   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 Sars Corona Virus 2 - negative LDL - 78  HgbA1c - 6.1 UDS - negative VTE prophylaxis - SCDs warfarin daily prior to admission, now on No antithrombotic given hemorrhage   Therapy recommendations:  SNF Disposition:  Medically stable to d/c to SNF 12/11. Discussed with CSW. COVID 12/9 negative in preparation for SNF transfer    Atrial Fibrillation w/ RVR Possible reason for home anticoagulation:  warfarin daily  Last INR 1.4 CHA2DS2-VASc Score =  , ?2 oral anticoagulation recommended             Age in Years:  ?10   +2                        Sex:  Female   Female   +1                      Hypertension History:  yes   +1                        Diabetes Mellitus:  yes   +1      Congestive Heart Failure History:   yes   +1             Vascular Disease History:   yes   +1                           Stroke/TIA/Thromboembolism History:   yes   +2 Converted  to NSR following cardizem gtt, now off TSH normal Remains tachycardic today - toprol increased to 75 by cardiology Cardiology on board Continue aspirin 81 mg daily at discharge Plan to resume warfarin in 1 month if remains stable - using warfarin d/t AF and hx Rheumatoid fever w/ MV stenosis  Hope for d/c 12/11 if HR stablizes   Mitral Stenosis Chronic Warfarin use Warfarin (INR 2.0) -> vitamin K - Repeat Protime 12/5 ->1.4   Pt reports hx cardiac thrombus 01/19/1991 admission to [facility]) Only other documentation since that time is from warfarin clinic at [facility] where they continued warfarin in setting of prior stroke. Have requested records from [facility] to see if we can review that admission Not a candidate for [facility] now, likely can resume in 1 month but unsure that is necessary   Hypertensive Emergency BP 181/102 on arrival to ED Home BP meds: Zestril 20; Toprol 50 XL daily Current BP meds: Zestril 30, Toprol 50 XL daily Was on cardene, now off BP Stable 140s SBP goal < 160 mm Hg   Long-term BP goal normotensive   Hyperlipidemia Home Lipid lowering medication: Zocor 40 mg daily LDL - 78, goal < 70 Current lipid lowering medication: None (statin contraindicated with ICH) Resume statin at discharge   Diabetes type II Home diabetic meds: glucophage Current diabetic meds: SSI HgbA1c - 6.1, goal < 7.0 Recent Labs (last 2 labs)        Recent Labs    02/26/19 1209 02/26/19 1842 02/26/19 2119  GLUCAP 106* 137* 111*      Other Stroke Risk Factors Advanced age Hx stroke/TIA -  remote right basal ganglia lacunar infarct (no details available in Epic)   Other Active Problems Possible aspiration PNA. Mild Leukocytosis, resolved - 10.0; TMax - 100.3 ; U/A -  Negative - CXR 02/21/19 - Potential pneumonia and or effusion in the left lung base-> Unasyn 12/6>> repeat CXR 12/8 w/ mild pulm edema, atx - will get OOB today Anemia 10.0  Hypokalemia 3.4->3.2- supplement     Hospital day # 6   PLAN Patient's heart rate appears much better controlled today after increasing the dose of metoprolol to 75 mg daily.  Cardiology plans on doing a Myoview scan to evaluate for cardiac ischemia.  We will continue her on aspirin 81 mg daily for the time being for  now.  May consider possible participation in the ASpire trial of atrial fibrillation patients with intracerebral hemorrhage versus restarting warfarin after 4 to 6 weeks.  Patient will likely need transfer for rehabilitation to SNF in the next few days when stable.  Long discussion with the patient and her son at the bedside and answered questions.   Greater than 50% time during this 25-minute visit was spent on counseling and coordination of care about her atrial fibrillation and discussion about risk benefit of anticoagulation and stroke and answering questions.   DISCHARGE EXAM Vitals:   02/26/19 2353 02/27/19 0359 02/27/19 0910 02/27/19 1256  BP: (!) 183/69 (!) 144/48 (!) 147/68 (!) 151/64  Pulse: 78 78 66 75  Resp: Temp: 98.2 F (36.8 C) 98.1 F (36.7 C) 98.3 F (36.8 C) 98.2 F (36.8 C)  TempSrc: Oral Oral Oral Oral  SpO2: 96% 96% 91% 93%  Weight:      Height:       Pleasant frail elderly Caucasian lady not in distress. . Afebrile. Head is nontraumatic. Neck is supple without bruit.    Cardiac exam no murmur or gallop. Lungs are clear to auscultation. Distal pulses are well felt. Neurological Exam : Awake alert oriented x2.  Diminished attention, recall.  No aphasia apraxia or dysarthria.  Extraocular movements are full range without nystagmus.  Blinks to threat bilaterally.  Motor system exam revealed no upper or lower extremity drift with symmetric equal strength in all 4 extremities except for mild weakness of left grip and intrinsic hand muscles.  Orbits right over left upper extremity.  Mild weakness of left hip flexors and ankle dorsiflexors only..  Plantars are downgoing.  Gait not  tested.  Discharge Diet    Diet Order             Diet Carb Modified Fluid consistency: Thin; Room service appropriate? Yes with Assist  Diet effective now               liquids  DISCHARGE PLAN Disposition:  Discharge to SNF aspirin 81 mg daily for secondary stroke prevention. Ongoing risk factor control by Primary Care Physician at time of discharge Follow-up Salli Real, MD in 2 weeks. Follow-up in [facility] in 4 weeks, office to schedule an appointment.  Follow up with Cardiology - Dr Mayford Knife - 4 weeks  50 minutes were spent preparing discharge.  Delton See PA-C [facility] Pager [phone] 02/27/2019, 4:21 PM I have personally obtained history,examined this patient, reviewed notes, independently viewed imaging studies, participated in medical decision making and plan of care.ROS completed by me personally and pertinent positives fully documented  I have made any additions or clarifications directly to the above note. Agree with note above.   Delia Heady, MD Medical Director [facility] Pager: [phone] 02/27/2019 4:55 PM

## 2019-02-27 NOTE — Social Work (Signed)
Clinical Social Worker facilitated patient discharge including contacting patient family and facility to confirm patient discharge plans.  Clinical information faxed to facility and family agreeable with plan.  CSW arranged ambulance transport via [facility] to [facility] RN to call [phone]  with report prior to discharge.  Clinical Social Worker will sign off for now as social work intervention is no longer needed. Please consult us again if new need arises.  Ross Bender, MSW, LCSWA Clinical Social Worker [phone]  

## 2019-03-03 ENCOUNTER — Telehealth (HOSPITAL_COMMUNITY): Payer: Self-pay | Admitting: *Deleted

## 2019-03-03 NOTE — Telephone Encounter (Signed)
Attempted calling patient to go over instructions for upcoming stress test.  There was no answer and could not leave a message on answering machine.

## 2019-03-04 NOTE — Telephone Encounter (Signed)
Error

## 2019-03-04 NOTE — Progress Notes (Deleted)
Cardiology Office Note   Date:  03/04/2019   ID:  Kimberly Mullins, DOB [birth year], MRN [id number]  PCP:  Sandi Mariscal, MD  Cardiologist:  Dr. Radford Pax, MD   No chief complaint on file.     History of Present Illness: Kimberly Mullins is a 85 y.o. female who presents for hospital follow-up, seen for Dr. Radford Pax.  Kimberly Mullins has a history of stroke with mild left-sided residual deficits, hypertension, diabetes mellitus, history of cardiac thrombus in the 1990s on Coumadin therapy and rheumatic fever as a child who was seen in hospital consultation 02/25/2019 for the evaluation of mitral stenosis and atrial fibrillation with RVR.   Patient presented to the ED via EMS on 02/20/2019 from her assisted living facility for further evaluation of bilateral leg weakness. Patient has some left sided deficits from prior stroke but patient reported left side being more weak than normal. Head CT showed small hemorrhage in the genu of the corpus callosum bilaterally with mild localized edema but no mass-effect or intraventricular hemorrhage. Systolic BP was greater than 180's in the ED and patient was started on Labetalol and Cardene drip. Home Coumadin was held. Echo on 02/23/2019 showed LVEF of 55-60% with normal wall motion, mild to moderate mitral stenosis with severe mitral annular calcification, mild aortic stenosis, and trace tricuspid regurgitation. She did well from a Neuro standpoint however, she did go into atrial fibrillation with rates as high as the 190's on cardiac consultation. She was started on IV Cardizem which was ultimately discontinued for Toprol-XL 75 mg p.o. daily and she converted to NSR.   Unfortunately, despite elevated CHA2DS2-VASc score, patient could not be anticoagulated secondary to hemorrhagic stroke with plans to restart Coumadin (as well as ASA therapy) when stable from a neurology standpoint, noted to be 1 month per chart review.  Patient had continued issues with uncontrolled  hypertension in which she was managed with Toprol XL 75 mg p.o. daily as well as lisinopril 30 mg p.o. daily.  During episodes of AF with RVR, EKG revealed ST depression which resolved after conversion to SR.  Had reports of occasional chest discomfort after eating a meal.  Given this, patient scheduled for stress testing 03/06/2019 which was found to be normal.  Today,    1. New Onset Atrial Fibrillation: -Atrial fibrillation with RVR during hospitalization however converted to NSR after being started on Cardizem drip>>then transitioned to Toprol XL 75mg  PO -EKG today  -CHA2DS2-VASc = 7 (HTN, DM, stroke x2, age x2, gender). Cannot being anticoagulated at this time due to hemorrhagic stroke. However, would plan to restart Coumadin when OK per Neurology. Per Neurology notes, will need to be off Coumadin for 1 month.  2. Chest Pain: - Patient reports history of chest pain solely with meals. Non-exertional. Currently chest pain free. - EKG today did show diffuse ST depressions while in atrial fibrillation with RVR. - Discussed with MD - will plan for outpatient Lexiscan for further ischemic evaluation.  2. 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  - Patient does have history of rheumatic fever on exam.  3. Hypertension - BP as high as 181/102 on admission and patient on Labetalol and Cardene drip. - Home medications include Lisinopril 20mg  daily and Toprol-XL 50mg  daily. - Labetolol and Cardene have been stopped and home Lisinopril increased to 30mg  daily.  - Most recent BP 110/52. - Discussed with MD - will increase Toprol  to 75mg  daily and decrease Lisinopril back down to home dose of 20mg  daily.  4. Hemorrhagic Stroke - Management per Neuro.  5. History of Cardiac Thrombus - Patient reportedly has history of cardiac thrombus found at time of first stroke in 1992. Has been on Coumadin since then. -  No LV thrombus noted on Echo this admission. - Given atrial fibrillation and mitral stenosis, will plan to restart Coumadin in about 1 month when cleared by Neuro.   Past Medical History:  Diagnosis Date   Diabetes mellitus without complication (HCC)    Hypertension    Stroke [facility])     Past Surgical History:  Procedure Laterality Date   APPENDECTOMY       Current Outpatient Medications  Medication Sig Dispense Refill   acetaminophen (TYLENOL) 325 MG tablet Take 650 mg by mouth every 6 (six) hours as needed for mild pain, fever or headache.     aspirin EC 81 MG EC tablet Take 1 tablet (81 mg total) by mouth daily. 30 tablet 1   calcium carbonate (TUMS - DOSED IN MG ELEMENTAL CALCIUM) 500 MG chewable tablet Chew 1 tablet by mouth 3 (three) times daily as needed for indigestion or heartburn.     cholecalciferol (VITAMIN D3) 25 MCG (1000 UT) tablet Take 1 tablet (1,000 Units total) by mouth daily after breakfast. 30 tablet 1   lisinopril (ZESTRIL) 30 MG tablet Take 1 tablet (30 mg total) by mouth daily. 30 tablet 1   loratadine (CLARITIN) 10 MG tablet Take 1 tablet (10 mg total) by mouth daily. 30 tablet 1   metoprolol succinate (TOPROL-XL) 25 MG 24 hr tablet Take 3 tablets (75 mg total) by mouth daily. 90 tablet 1   omega-3 acid ethyl esters (LOVAZA) 1 g capsule Take 1 capsule (1,000 mg total) by mouth daily. 30 capsule 1   potassium chloride (KLOR-CON) 10 MEQ tablet Take 1 tablet (10 mEq total) by mouth 2 (two) times daily for 6 days. 12 tablet 0   simvastatin (ZOCOR) 40 MG tablet Take 40 mg by mouth at bedtime.      No current facility-administered medications for this visit.    Allergies:   Erythromycin    Social History:  The patient  reports that she has never smoked. She has never used smokeless tobacco. She reports that she does not drink alcohol or use drugs.   Family History:  The patient's ***family history includes CAD in her father.    ROS:  Please see  the history of present illness.   Otherwise, review of systems are positive for {NONE DEFAULTED:18576::"none"}.   All other systems are reviewed and negative.    PHYSICAL EXAM: VS:  There were no vitals taken for this visit. , BMI There is no height or weight on file to calculate BMI.   General: Well developed, well nourished, NAD Skin: Warm, dry, intact  Head: Normocephalic, atraumatic, sclera non-icteric, no xanthomas, clear, moist mucus membranes. Neck: Negative for carotid bruits. No JVD Lungs:Clear to ausculation bilaterally. No wheezes, rales, or rhonchi. Breathing is unlabored. Cardiovascular: RRR with S1 S2. No murmurs, rubs, gallops, or LV heave appreciated. Abdomen: Soft, non-tender, non-distended with normoactive bowel sounds. No hepatomegaly, No rebound/guarding. No obvious abdominal masses. MSK: Strength and tone appear normal for age. 5/5 in all extremities Extremities: No edema. No clubbing or cyanosis. DP/PT pulses 2+ bilaterally Neuro: Alert and oriented. No focal deficits. No facial asymmetry. MAE spontaneously. Psych: Responds to questions appropriately with normal affect.  EKG:  EKG {ACTION; IS/IS [id number]}OT:21021397} ordered today. The ekg ordered today demonstrates ***   Recent Labs: 02/25/2019: Magnesium 1.5; TSH 1.297 02/27/2019: BUN 12; Creatinine, Ser 0.61; Hemoglobin 9.6; Platelets 239; Potassium 3.3; Sodium 140    Lipid Panel    Component Value Date/Time   CHOL 155 02/24/2019 0506   TRIG 86 02/24/2019 0506   HDL 60 02/24/2019 0506   CHOLHDL 2.6 02/24/2019 0506   VLDL 17 02/24/2019 0506   LDLCALC 78 02/24/2019 0506      Wt Readings from Last 3 Encounters:  02/24/19 163 lb 2.3 oz (74 kg)  10/25/18 154 lb (69.9 kg)  05/06/16 155 lb (70.3 kg)      Other studies Reviewed: Additional studies/ records that were reviewed today include:   Echocardiogram 02/23/2019: Impressions: 1. Left ventricular ejection fraction, by visual estimation, is 55 to  60%. The left ventricle has normal function. There is no left ventricular hypertrophy. 2. The left ventricle has no regional wall motion abnormalities. 3. Global right ventricle has normal systolic function.The right ventricular size is normal. No increase in right ventricular wall thickness. 4. Left atrial size was mildly dilated. 5. Right atrial size was normal. 6. Moderate calcification of the mitral valve leaflet(s). 7. Moderate thickening of the mitral valve leaflet(s). 8. Severe mitral annular calcification. 9. The mitral valve is abnormal. Mild mitral valve regurgitation. Mild-moderate mitral stenosis. 10. Mitral stenosis mild by pressure half time/mitral valve area, moderate by gradient. No prior for comparison. 11. The tricuspid valve is normal in structure. Tricuspid valve regurgitation is trivial. 12. The aortic valve was not well visualized. Aortic valve regurgitation is trivial. Mild aortic valve stenosis. 13. The pulmonic valve was not well visualized. Pulmonic valve regurgitation is not visualized. 14. The inferior vena cava is dilated in size with >50% respiratory variability, suggesting right atrial pressure of 8 mmHg. 15. The atrial septum is grossly normal.   ASSESSMENT AND PLAN:  1.  ***   Current medicines are reviewed at length with the patient today.  The patient {ACTIONS; HAS/DOES NOT HAVE:19233} concerns regarding medicines.  The following changes have been made:  {PLAN; NO CHANGE:13088:s}  Labs/ tests ordered today include: *** No orders of the defined types were placed in this encounter.    Disposition:   FU with *** in {gen number 9-81:191478}0-10:310397} {Days to years:10300}  Signed, Georgie ChardJill Kaely Hollan, NP  03/04/2019 11:11 AM    [facility] [address], [place], [place]  [address] Phone: [phone]) [phone]; Fax: [phone]

## 2019-03-06 ENCOUNTER — Other Ambulatory Visit: Payer: Self-pay

## 2019-03-06 ENCOUNTER — Ambulatory Visit (HOSPITAL_COMMUNITY): Payer: No Typology Code available for payment source | Attending: Cardiovascular Disease

## 2019-03-06 VITALS — Ht 62.0 in | Wt 163.0 lb

## 2019-03-06 DIAGNOSIS — R9431 Abnormal electrocardiogram [ECG] [EKG]: Secondary | ICD-10-CM | POA: Diagnosis present

## 2019-03-06 DIAGNOSIS — R079 Chest pain, unspecified: Secondary | ICD-10-CM | POA: Insufficient documentation

## 2019-03-06 MED ORDER — TECHNETIUM TC 99M TETROFOSMIN IV KIT
31.4000 | PACK | Freq: Once | INTRAVENOUS | Status: AC | PRN
  Administered 2019-03-06: 31.4 via INTRAVENOUS
  Filled 2019-03-06: qty 32

## 2019-03-09 ENCOUNTER — Other Ambulatory Visit: Payer: Self-pay

## 2019-03-09 ENCOUNTER — Ambulatory Visit (HOSPITAL_COMMUNITY): Payer: No Typology Code available for payment source | Attending: Cardiology

## 2019-03-09 DIAGNOSIS — R9431 Abnormal electrocardiogram [ECG] [EKG]: Secondary | ICD-10-CM | POA: Insufficient documentation

## 2019-03-09 DIAGNOSIS — R079 Chest pain, unspecified: Secondary | ICD-10-CM | POA: Diagnosis present

## 2019-03-09 LAB — MYOCARDIAL PERFUSION IMAGING
LV dias vol: 45 mL (ref 46–106)
LV sys vol: 9 mL
Peak HR: 111 {beats}/min
Rest HR: 86 {beats}/min
SDS: 6
SRS: 0
SSS: 6
TID: 1.13

## 2019-03-09 MED ORDER — REGADENOSON 0.4 MG/5ML IV SOLN
0.4000 mg | Freq: Once | INTRAVENOUS | Status: AC
  Administered 2019-03-09: 0.4 mg via INTRAVENOUS

## 2019-03-09 MED ORDER — TECHNETIUM TC 99M TETROFOSMIN IV KIT
32.9000 | PACK | Freq: Once | INTRAVENOUS | Status: AC | PRN
  Administered 2019-03-09: 32.9 via INTRAVENOUS
  Filled 2019-03-09: qty 33

## 2019-03-11 ENCOUNTER — Other Ambulatory Visit: Payer: Self-pay

## 2019-03-11 ENCOUNTER — Ambulatory Visit (HOSPITAL_COMMUNITY)
Admit: 2019-03-11 | Discharge: 2019-03-11 | Disposition: A | Discharge status: Home / Self Care | Payer: No Typology Code available for payment source | Source: Ambulatory Visit | Attending: Nurse Practitioner | Admitting: Nurse Practitioner

## 2019-03-11 ENCOUNTER — Encounter (HOSPITAL_COMMUNITY): Payer: Self-pay | Admitting: Nurse Practitioner

## 2019-03-11 VITALS — BP 144/70 | HR 95 | Ht 62.0 in | Wt 150.6 lb

## 2019-03-11 DIAGNOSIS — I4891 Unspecified atrial fibrillation: Secondary | ICD-10-CM

## 2019-03-11 DIAGNOSIS — D6869 Other thrombophilia: Secondary | ICD-10-CM

## 2019-03-11 DIAGNOSIS — Z8249 Family history of ischemic heart disease and other diseases of the circulatory system: Secondary | ICD-10-CM | POA: Diagnosis not present

## 2019-03-11 DIAGNOSIS — E119 Type 2 diabetes mellitus without complications: Secondary | ICD-10-CM | POA: Insufficient documentation

## 2019-03-11 DIAGNOSIS — Z8673 Personal history of transient ischemic attack (TIA), and cerebral infarction without residual deficits: Secondary | ICD-10-CM | POA: Diagnosis not present

## 2019-03-11 DIAGNOSIS — I10 Essential (primary) hypertension: Secondary | ICD-10-CM | POA: Diagnosis not present

## 2019-03-11 DIAGNOSIS — Z79899 Other long term (current) drug therapy: Secondary | ICD-10-CM | POA: Diagnosis not present

## 2019-03-11 DIAGNOSIS — Z7982 Long term (current) use of aspirin: Secondary | ICD-10-CM | POA: Insufficient documentation

## 2019-03-11 NOTE — Progress Notes (Signed)
Primary Care Physician: Robyne Peers, MD Referring Physician: [facility] f/u  Cardiologist: Dr. Radford Pax( as of this hospitalization)  Neurologist: Dr. Fuller Mandril Kimberly Mullins is a 85 y.o. female with a h/o  for hypertension, diabetes mellitus, prior stroke with mild left-sided residual deficits presented to the emergency department 02/20/19,  with generalized weakness and difficulty walking since yesterday night.Head CT revealed a small hemorrhage in the  genu of the corpus callosum bilaterally with mild localized edema but no mass-effect or intraventricular hemorrhage.pt was taking warfarin for prior strokes. She developed new onset afib with rvr and was converted with Cardizem drip. Warfarin is on hold x one month per neurology. She was discharged 12/11 and had a stress test 12/21 with a normal myoview. She had been on BB for years, the dose was increased at time of discharge.   She is now in the afib clinic for f/u. She remains off anticoagulation. She is in SR.  She does not appear to have any deficients and is oriented  in the office and answers questions re her history appropriately. Her daughter accompanies her.   She does not snore, per daughter, minimal caffeine, no tobacco or alcohol.   Today, she denies symptoms of palpitations, chest pain, shortness of breath, orthopnea, PND, lower extremity edema, dizziness, presyncope, syncope, or neurologic sequela. The patient is tolerating medications without difficulties and is otherwise without complaint today.   Past Medical History:  Diagnosis Date  . Diabetes mellitus without complication ([place])   . Hypertension   . Stroke [facility])    Past Surgical History:  Procedure Laterality Date  . APPENDECTOMY      Current Outpatient Medications  Medication Sig Dispense Refill  . acetaminophen (TYLENOL) 325 MG tablet Take 650 mg by mouth every 6 (six) hours as needed for mild pain, fever or headache.    Marland Kitchen ascorbic acid (VITAMIN C) 1000 MG tablet  Take by mouth.    Marland Kitchen aspirin EC 81 MG EC tablet Take 1 tablet (81 mg total) by mouth daily. 30 tablet 1  . calcium carbonate (TUMS - DOSED IN MG ELEMENTAL CALCIUM) 500 MG chewable tablet Chew 1 tablet by mouth 3 (three) times daily as needed for indigestion or heartburn.    . cholecalciferol (VITAMIN D3) 25 MCG (1000 UT) tablet Take 1 tablet (1,000 Units total) by mouth daily after breakfast. 30 tablet 1  . lisinopril (ZESTRIL) 30 MG tablet Take 1 tablet (30 mg total) by mouth daily. 30 tablet 1  . loratadine (CLARITIN) 10 MG tablet Take 1 tablet (10 mg total) by mouth daily. 30 tablet 1  . metoprolol succinate (TOPROL-XL) 25 MG 24 hr tablet Take 3 tablets (75 mg total) by mouth daily. 90 tablet 1  . omega-3 acid ethyl esters (LOVAZA) 1 g capsule Take 1 capsule (1,000 mg total) by mouth daily. 30 capsule 1  . potassium chloride (KLOR-CON) 10 MEQ tablet Take 1 tablet (10 mEq total) by mouth 2 (two) times daily for 6 days. 12 tablet 0  . simvastatin (ZOCOR) 40 MG tablet Take 40 mg by mouth at bedtime.      No current facility-administered medications for this encounter.    Allergies  Allergen Reactions  . Erythromycin Hives    Social History   Socioeconomic History  . Marital status: Married    Spouse name: Not on file  . Number of children: Not on file  . Years of education: Not on file  . Highest education level: Not on  file  Occupational History  . Not on file  Tobacco Use  . Smoking status: Never Smoker  . Smokeless tobacco: Never Used  Substance and Sexual Activity  . Alcohol use: Never  . Drug use: Never  . Sexual activity: Not on file  Other Topics Concern  . Not on file  Social History Narrative  . Not on file   Social Determinants of Health   Financial Resource Strain:   . Difficulty of Paying Living Expenses: Not on file  Food Insecurity:   . Worried About Programme researcher, broadcasting/film/video in the Last Year: Not on file  . Ran Out of Food in the Last Year: Not on file    Transportation Needs:   . Lack of Transportation (Medical): Not on file  . Lack of Transportation (Non-Medical): Not on file  Physical Activity:   . Days of Exercise per Week: Not on file  . Minutes of Exercise per Session: Not on file  Stress:   . Feeling of Stress : Not on file  Social Connections:   . Frequency of Communication with Friends and Family: Not on file  . Frequency of Social Gatherings with Friends and Family: Not on file  . Attends Religious Services: Not on file  . Active Member of Clubs or Organizations: Not on file  . Attends Banker Meetings: Not on file  . Marital Status: Not on file  Intimate Partner Violence:   . Fear of Current or Ex-Partner: Not on file  . Emotionally Abused: Not on file  . Physically Abused: Not on file  . Sexually Abused: Not on file    Family History  Problem Relation Age of Onset  . CAD Father        died at age 50  . Atrial fibrillation Neg Hx     ROS- All systems are reviewed and negative except as per the HPI above  Physical Exam: Vitals:   03/11/19 1054  BP: (!) 144/70  Pulse: 95  Weight: 68.3 kg  Height: 5\' 2"  (1.575 m)   Wt Readings from Last 3 Encounters:  03/11/19 68.3 kg  03/06/19 73.9 kg  02/24/19 74 kg    Labs: Lab Results  Component Value Date   NA 140 02/27/2019   K 3.3 (L) 02/27/2019   CL 105 02/27/2019   CO2 24 02/27/2019   GLUCOSE 103 (H) 02/27/2019   BUN 12 02/27/2019   CREATININE 0.61 02/27/2019   CALCIUM 8.8 (L) 02/27/2019   MG 1.5 (L) 02/25/2019   Lab Results  Component Value Date   INR 1.4 (H) 02/21/2019   Lab Results  Component Value Date   CHOL 155 02/24/2019   HDL 60 02/24/2019   LDLCALC 78 02/24/2019   TRIG 86 02/24/2019     GEN- The patient is well appearing, alert and oriented x 3 today.   Head- normocephalic, atraumatic Eyes-  Sclera clear, conjunctiva pink Ears- hearing intact Oropharynx- clear Neck- supple, no JVP Lymph- no cervical  lymphadenopathy Lungs- Clear to ausculation bilaterally, normal work of breathing Heart- Regular rate and rhythm, no murmurs, rubs or gallops, PMI not laterally displaced GI- soft, NT, ND, + BS Extremities- no clubbing, cyanosis, or edema MS- no significant deformity or atrophy Skin- no rash or lesion Psych- euthymic mood, full affect Neuro- strength and sensation are intact  EKG-NSR at 95 bpm, pr int 122 ms, qrs int 74 ms, qtc 444 ms  Echo-1. Left ventricular ejection fraction, by visual estimation, is 55 to  60%. The left ventricle has normal function. There is no left ventricular hypertrophy. 2. The left ventricle has no regional wall motion abnormalities. 3. Global right ventricle has normal systolic function.The right ventricular size is normal. No increase in right ventricular wall thickness. 4. Left atrial size was mildly dilated. 5. Right atrial size was normal. 6. Moderate calcification of the mitral valve leaflet(s). 7. Moderate thickening of the mitral valve leaflet(s). 8. Severe mitral annular calcification. 9. The mitral valve is abnormal. Mild mitral valve regurgitation. Mild-moderate mitral stenosis. 10. Mitral stenosis mild by pressure half time/mitral valve area, moderate by gradient. No prior for comparison. 11. The tricuspid valve is normal in structure. Tricuspid valve regurgitation is trivial. 12. The aortic valve was not well visualized. Aortic valve regurgitation is trivial. Mild aortic valve stenosis. 13. The pulmonic valve was not well visualized. Pulmonic valve regurgitation is not visualized. 14. The inferior vena cava is dilated in size with >50% respiratory variability, suggesting right atrial pressure of 8 mmHg. 15. The atrial septum is grossly normal   Stress test-Nuclear stress EF: 80%.  There was no ST segment deviation noted during stress.  The study is normal.  This is a low risk study.  The left ventricular ejection fraction is hyperdynamic  (>65%). No prior study for comparison.  Assessment and Plan: 1. New onset afib in the setting of a cerebral bleed Now in SR  General education re afib discussed  No significant  lifestyle issues contributing Continue Toprol xl 75 mg qd  2. CHA2DS2VASc score of at least 6 To be off warfarin x one month from bleed  It appears that neurology tried to call to make f/u appointment on home  phone but she is not staying there now. Neurology phone number given to daughter to call and make appointment Defer to neurology when to restart anticoagulation   3. HTN Stable   Has f/u with cardiology, Roselyn ReefJill McDonald, NP,  12/28  afib clinic as needed

## 2019-03-11 NOTE — Addendum Note (Signed)
Encounter addended by: Hinda Kehr, CMA on: 03/11/2019 1:59 PM  Actions taken: Order list changed

## 2019-03-16 ENCOUNTER — Ambulatory Visit: Payer: Medicare Other | Admitting: Cardiology

## 2019-03-16 ENCOUNTER — Telehealth: Payer: Self-pay | Admitting: Cardiology

## 2019-03-16 NOTE — Telephone Encounter (Signed)
   Patients daughter calling to report her mother developed a cough yesterday. Had a [phone] test yesterday. Patient is a resident at [facility]. Daughter states patient may has been in contact with staff that are caring for positive [phone] patient.  Should appointment for today be postponed pending covid19 result?  Please call daughter,Tammy

## 2019-03-16 NOTE — Telephone Encounter (Signed)
I called and spoke with patients daughter Lynelle Smoke, appointment today was rescheduled to 04/03/19 with Kathyrn Drown due to possible Covid exposure.

## 2019-04-01 ENCOUNTER — Ambulatory Visit: Payer: Medicare Other | Admitting: Cardiology

## 2019-04-15 ENCOUNTER — Inpatient Hospital Stay: Payer: Medicare Other | Admitting: Adult Health

## 2019-04-15 NOTE — Progress Notes (Signed)
Cardiology Office Note   Date:  04/22/2019   ID:  Kimberly Mullins, DOB 04/02/1931, MRN [id number]  PCP:  Angelica Chessman, MD  Cardiologist: Dr. Armanda Magic, MD  Chief Complaint  Kimberly Mullins presents with  . Follow-up    CVA and A fibrillation     History of Present Illness:  Kimberly Mullins is a 86 y.o. female who presents for follow-up of atrial fibrillation, seen for Dr. Mayford Knife.  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  Kimberly Mullins reportedly has a history of cardiac thrombus in the 1990's for which she was started on Coumadin. She has been continued on Coumadin since that time due to prior stroke.   Kimberly Mullins presented to the ED via EMS on 02/20/2019 from her assisted living facility for further evaluation of bilateral leg weakness. Head CT showed small hemorrhage in the genu of the corpus callosum bilaterally with mild localized edema but no mass-effect or intraventricular hemorrhage. Systolic BP was greater than 180's in the ED and Kimberly Mullins was started on Labetalol and Cardene drip. Home Coumadin was held. Echo on 02/23/2019 showed LVEF of 55-60% with normal wall motion, mild to moderate mitral stenosis with severe mitral annular calcification, mild aortic stenosis, and trace tricuspid regurgitation. She did well from a Neuro standpoint however, she went into atrial fibrilltion with rates as high as the 190's.she was started on IV Cardizem with cardiology consultation.   EKG showed atrial fibrillation with diffuse ST depression with repeat showing NSR and no ST changes.  On echocardiogram had evidence of rheumatic heart disease with rheumatic MS and AS. Plan per neurology was for one month Coumadin hold>>possible restart 03/30/2019. Given her EKG changes plan was for nuclear  stress testing which was performed 03/09/2019 that was normal.   She was most recently seen in the atrial fibrillation clinic at which time she continued to not be anticoagulated per neurology's request.  She was in normal sinus rhythm.  She was continued on Toprol-XL 75 mg p.o. daily.  Neurology phone number was given at that visit for recommendations on anticoagulation restart date.  Kimberly Mullins denies any current problems status post hospital stay for CVA and atrial fibrillation.  She has no focal neurologic deficits as a result of the CVA.  She walks with a walker.  She denies any sensation of palpitations, arrhythmias, rapid heart rates.  Denies any syncopal or near syncopal episodes.  States her primary symptoms prior to presentation to the hospital was bilateral leg weakness and inability to stand erect from a sitting position.  He has had no left or right-sided hemiplegia/hemiparesis.  Speech is appropriate without aphasia.  Denies any blurred vision.     1. New Onset Atrial Fibrillation -Converted back to normal sinus rhythm after being started on Cardizem - Kimberly Mullins went into atrial fibrillation with RVR while hospitalized but m drip. No known history of atrial fibrillation. - She continues to remain in NSR - Echo showed normal EF and wall motion. - K+ 3.3 today - TSH normal at 1.297  - Cardizem gtt stopped - increasedToprol to 75mg  daily due to sinus tach yesterday and her HR is much improved in the 70-80's - CHA2DS2-VASc = 7 (HTN, DM, stroke x2, age x2, gender). Cannot be anticoagulated at this time due to  hemorrhagic stroke. Ho weve would plan to restart Coumadin when OK per Neurology. Per Neurology notes, will need to be off Coumadin for 1 month.r,  2. Chest Pain   - Kimberly Mullins reports history of chest pain solely with meals. Non-exertional. Currently chest pain free. - EKG today did show diffuse ST depressions while in atrial fibrillation with RVR. - 2D echo with normal LVF - recommend  outpt nuclear stress test  3. 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  - Kimberly Mullins does have history of rheumatic fever on exam.  3. Hypertension - BP as high as 181/102 on admission and Kimberly Mullins on Labetalol and Cardene drip. - Home medications include Lisinopril 20mg  daily and Toprol-XL 50mg  daily. - Labetolol and Cardene have been stopped  - BP elevated early this am at 144/80mmHg but elevated higher during the day yesterday - continue Toprol XL to 75mg  daily  - increase Lisinopril to 30mg  daily  4. Hemorrhagic stroke: - Management per Neuro.  5. History of cardiac thrombus: - Kimberly Mullins reportedly has history of cardiac thrombus found at time of first stroke in 1992. Has been on Coumadin since then. - No LV thrombus noted on Echo this admission. - Given atrial fibrillation and mitral stenosis, will plan to restart Coumadin in about 1 month when cleared by Neuro.    Past Medical History:  Diagnosis Date  . Diabetes mellitus without complication (HCC)   . Hypertension   . Stroke [facility])     Past Surgical History:  Procedure Laterality Date  . APPENDECTOMY       Current Outpatient Medications  Medication Sig Dispense Refill  . acetaminophen (TYLENOL) 325 MG tablet Take 650 mg by mouth every 6 (six) hours as needed for mild pain, fever or headache.    ascorbic acid (VITAMIN C) 1000 MG tablet Take by mouth.    aspirin EC 81 MG EC tablet Take 1 tablet (81 mg total) by mouth daily. 30 tablet 1  . calcium carbonate (TUMS - DOSED IN MG ELEMENTAL CALCIUM) 500 MG chewable tablet Chew 1 tablet by mouth 3 (three) times daily as needed for indigestion or heartburn.    . cholecalciferol (VITAMIN D3) 25 MCG (1000 UT) tablet Take 1 tablet (1,000 Units total) by mouth daily after breakfast. 30 tablet 1  . lisinopril (ZESTRIL) 30 MG tablet Take 1 tablet (30 mg total) by mouth daily. 30 tablet 1  .  loratadine (CLARITIN) 10 MG tablet Take 1 tablet (10 mg total) by mouth daily. 30 tablet 1  . metoprolol succinate (TOPROL-XL) 50 MG 24 hr tablet Take 2 tablets by mouth 2 (two) times daily at 10 AM and 5 PM.    . omega-3 acid ethyl esters (LOVAZA) 1 g capsule Take 1 capsule (1,000 mg total) by mouth daily. 30 capsule 1  . simvastatin (ZOCOR) 40 MG tablet Take 40 mg by mouth at bedtime.      No current facility-administered medications for this visit.    Allergies:   Erythromycin    Social History:  The Kimberly Mullins  reports that she has never smoked. She has never used smokeless tobacco. She reports that she does not drink alcohol or use drugs.   Family History:  The Kimberly Mullins'sfamily history includes CAD in her father.    ROS:  Please see the history of present illness.   Otherwise, review of systems are positive for none.   All other systems are reviewed and  negative.    PHYSICAL EXAM:  VS:  BP 136/78   Pulse (!) 103   Ht 5\' 2"  (1.575 m)   Wt 149 lb (67.6 kg)   SpO2 96%   BMI 27.25 kg/m  , BMI Body mass index is 27.25 kg/m.    GEN: Well nourished, well developed, in no acute distress Neck: no JVD, carotid bruits, or masses Cardiac: RRR tachycardic; mild systolic ejection murmur best heard at left lower sternal border.  No rubs, or gallops,no edema  Respiratory:  clear to auscultation bilaterally, normal work of breathing MS: no deformity or atrophy. Neck is forward flexed on exam. Skin: warm and dry, no rash Neuro:  Strength and sensation are intact. She uses a walker/ rollator to ambulate Psych: euthymic mood, full affect   EKG:  EKG is not ordered today.   Recent Labs: 02/25/2019: Magnesium 1.5; TSH 1.297 02/27/2019: BUN 12; Creatinine, Ser 0.61; Hemoglobin 9.6; Platelets 239; Potassium 3.3; Sodium 140    Lipid Panel    Component Value Date/Time   CHOL 155 02/24/2019 0506   TRIG 86 02/24/2019 0506   HDL 60 02/24/2019 0506   CHOLHDL 2.6 02/24/2019 0506   VLDL 17  02/24/2019 0506   LDLCALC 78 02/24/2019 0506      Wt Readings from Last 3 Encounters:  04/22/19 149 lb (67.6 kg)  03/11/19 150 lb 9.6 oz (68.3 kg)  03/06/19 163 lb (73.9 kg)      Other studies Reviewed: Additional studies/ records that were reviewed today include:  Lexiscan Myoview stress test 03/06/2019  Nuclear stress EF: 80%.  There was no ST segment deviation noted during stress.  The study is normal.  This is a low risk study.  The left ventricular ejection fraction is hyperdynamic (>65%).  No prior study for comparison.  2D echo 02/2019 IMPRESSIONS  1. Left ventricular ejection fraction, by visual estimation, is 55 to 60%. The left ventricle has normal function. There is no left ventricular hypertrophy. 2. The left ventricle has no regional wall motion abnormalities. 3. Global right ventricle has normal systolic function.The right ventricular size is normal. No increase in right ventricular wall thickness. 4. Left atrial size was mildly dilated. 5. Right atrial size was normal. 6. Moderate calcification of the mitral valve leaflet(s). 7. Moderate thickening of the mitral valve leaflet(s). 8. Severe mitral annular calcification. 9. The mitral valve is abnormal. Mild mitral valve regurgitation. Mild-moderate mitral stenosis. 10. Mitral stenosis mild by pressure half time/mitral valve area, moderate by gradient. No prior for comparison. 11. The tricuspid valve is normal in structure. Tricuspid valve regurgitation is trivial. 12. The aortic valve was not well visualized. Aortic valve regurgitation is trivial. Mild aortic valve stenosis. 13. The pulmonic valve was not well visualized. Pulmonic valve regurgitation is not visualized. 14. The inferior vena cava is dilated in size with >50% respiratory variability, suggesting right atrial pressure of 8 mmHg. 15. The atrial septum is grossly normal.  ASSESSMENT AND PLAN:  1. New onset a-fib ([place]) EKG  performed on December 23 showed normal sinus rhythm with poor anterior R wave progression.  When compared with EKG of 02/25/2019 the rhythm is no longer atrial fibrillation and ST depression is no longer present.  Heart rate today is 103 and regular.  Kimberly Mullins states her heart rate usually runs in the high 90s generally.  Continue Toprol-XL 50 mg 2 tablets a.m. and 2 tablets p.m.  Continue aspirin 81 mg.  Will await neuro input on when to restart anticoagulant therapy.  2. Hemorrhagic cerebrovascular accident (CVA) (HCC) Recent admission for hemorrhagic CVA with CT of the head demonstrating small hemorrhage in the genu of the corpus callosum bilaterally with mild localized edema but no mass-effect or intraventricular hemorrhage.  Kimberly Mullins's Coumadin was stopped as a result.  She is to follow-up with neurology in March for decision making as to when to restart anticoagulant.  Kimberly Mullins denies any residual focal neurologic deficits.  She is undergoing physical therapy/rehab and is doing well.  3. Rheumatic mitral stenosis Recent echocardiogram in December 2020 which showed severe mitral annular calcification.  mitral valve is abnormal. Mild mitral valve regurgitation. Mild-moderate mitral stenosis.  Mitral stenosis mild by pressure half time/mitral valve area, moderate by gradient. No prior for comparison.  Kimberly Mullins is asymptomatic.  4. Essential hypertension Blood pressure 136/78.  Monitor blood pressures for any sustained systolic blood pressures at or greater than 140 and/or diastolic blood pressures equal or greater than 90.   Current medicines are reviewed at length with the Kimberly Mullins today.  The Kimberly Mullins does not have concerns regarding medicines.  The following changes have been made:  no change  Labs/ tests ordered today include:     Disposition:   FU with Dr Mayford Knife in 2 month  Signed, Netta Neat, NP  04/22/2019 2:55 PM    [facility] [address] [place],  [place], [place]  [address] Phone: [phone]; Fax: [phone]

## 2019-04-22 ENCOUNTER — Ambulatory Visit (INDEPENDENT_AMBULATORY_CARE_PROVIDER_SITE_OTHER): Payer: Medicare Other | Admitting: Family Medicine

## 2019-04-22 ENCOUNTER — Other Ambulatory Visit: Payer: Self-pay

## 2019-04-22 ENCOUNTER — Encounter: Payer: Self-pay | Admitting: Cardiology

## 2019-04-22 ENCOUNTER — Encounter (INDEPENDENT_AMBULATORY_CARE_PROVIDER_SITE_OTHER): Payer: Self-pay

## 2019-04-22 VITALS — BP 136/78 | HR 103 | Ht 62.0 in | Wt 149.0 lb

## 2019-04-22 DIAGNOSIS — I4891 Unspecified atrial fibrillation: Secondary | ICD-10-CM | POA: Diagnosis not present

## 2019-04-22 DIAGNOSIS — I10 Essential (primary) hypertension: Secondary | ICD-10-CM

## 2019-04-22 DIAGNOSIS — I619 Nontraumatic intracerebral hemorrhage, unspecified: Secondary | ICD-10-CM | POA: Diagnosis not present

## 2019-04-22 DIAGNOSIS — I050 Rheumatic mitral stenosis: Secondary | ICD-10-CM

## 2019-04-22 NOTE — Patient Instructions (Signed)
Medication Instructions:   Your physician recommends that you continue on your current medications as directed. Please refer to the Current Medication list given to you today.  *If you need a refill on your cardiac medications before your next appointment, please call your pharmacy*  Lab Work:  None ordered today  If you have labs (blood work) drawn today and your tests are completely normal, you will receive your results only by: Marland Kitchen MyChart Message (if you have MyChart) OR . A paper copy in the mail If you have any lab test that is abnormal or we need to change your treatment, we will call you to review the results.  Testing/Procedures:  None ordered today  Follow-Up: At [facility], you and your health needs are our priority.  As part of our continuing mission to provide you with exceptional heart care, we have created designated Provider Care Teams.  These Care Teams include your primary Cardiologist (physician) and Advanced Practice Providers (APPs -  Physician Assistants and Nurse Practitioners) who all work together to provide you with the care you need, when you need it.  Your next appointment:    On 06/29/19 at 2:20PM with Armanda Magic, MD

## 2019-05-07 ENCOUNTER — Inpatient Hospital Stay: Payer: Medicare Other | Admitting: Adult Health

## 2019-05-20 ENCOUNTER — Ambulatory Visit (INDEPENDENT_AMBULATORY_CARE_PROVIDER_SITE_OTHER): Payer: Medicare Other | Admitting: Adult Health

## 2019-05-20 ENCOUNTER — Encounter: Payer: Self-pay | Admitting: Adult Health

## 2019-05-20 ENCOUNTER — Other Ambulatory Visit: Payer: Self-pay

## 2019-05-20 VITALS — BP 131/74 | HR 90 | Temp 97.8°F | Ht 62.0 in | Wt 146.0 lb

## 2019-05-20 DIAGNOSIS — E119 Type 2 diabetes mellitus without complications: Secondary | ICD-10-CM

## 2019-05-20 DIAGNOSIS — I480 Paroxysmal atrial fibrillation: Secondary | ICD-10-CM | POA: Diagnosis not present

## 2019-05-20 DIAGNOSIS — I610 Nontraumatic intracerebral hemorrhage in hemisphere, subcortical: Secondary | ICD-10-CM | POA: Diagnosis not present

## 2019-05-20 DIAGNOSIS — I10 Essential (primary) hypertension: Secondary | ICD-10-CM | POA: Diagnosis not present

## 2019-05-20 DIAGNOSIS — E7801 Familial hypercholesterolemia: Secondary | ICD-10-CM | POA: Diagnosis not present

## 2019-05-20 NOTE — Progress Notes (Signed)
[facility] [address] [place]. [place] [address] [phone]       HOSPITAL FOLLOW UP NOTE  Ms. Kimberly Mullins Date of Birth:  [birth year] Medical Record Number:  [id number]   Reason for Referral:  hospital stroke follow up    CHIEF COMPLAINT:  Chief Complaint  Patient presents with  . Hospitalization Follow-up    Daughter present. Rm 9. Patient's daughter mentioned that she would like to discuss to when she will be able to start on a blood thinner.     HPI: Kimberly Mullins being seen today for in office hospital follow-up regarding hypertensive hemorrhage in setting of warfarin use on 02/20/2019.  History obtained from patient, daughter and chart review. Reviewed all radiology images and labs personally.  Kimberly Mullins a lovely 86 y.o.femalewith history of hypertension, diabetes mellitus, previous stroke with mild left-sided residual deficits, and Coumadin PTA (INR 2.0) presented on 02/20/2019 to the emergency department with generalized weakness and difficulty walking. Evaluated by stroke team and Dr. 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  Initially question use of warfarin but was found to have atrial fibrillation with RVR (unable to determine prior history) and recommended aspirin 81 mg daily at discharge and possibly restarting warfarin 1 month after discharge.  2D echo normal EF but did show moderate mitral stenosis and also possible reason for chronic warfarin use.  Found in hypertensive emergency on arrival stabilized during admission and recommended BP goal normotensive range.  LDL 78 and recommended continuation of home statin at discharge.  Controlled DM with A1c 6.1.  Other stroke risk factors include advanced age and prior history of right BG stroke.  Other active problems include possible aspiration pneumonia, anemia and  hypokalemia.  Discharge to SNF rehab on 02/27/2019 for ongoing therapies with residual cognitive impairment, and mild left-sided weakness.  Kimberly Mullins is a 86 year old female who is being seen today for hospital follow-up accompanied by her daughter.  Residual stroke deficits include mild left sided weakness with ongoing improvement.  Denies residual memory concerns or cognitive concerns.  Has since been discharged from [facility] SNF rehab on 03/18/2019 back to independent living at [facility] [place]. She has been participating with PT/OT.  Anticoagulation therapy has not been restarted at this time and continues on aspirin without bleeding or bruising.  Cardiology is currently awaiting neurology clearance prior to restarting.  Continues on simvastatin without myalgias. Blood pressure today 131/74.  Daughter concerned regarding discontinuing of Metformin at hospitalization for unknown reasons.  No further concerns at this time.    ROS:   14 system review of systems performed and negative with exception of weakness  PMH:  Past Medical History:  Diagnosis Date  . Diabetes mellitus without complication ([place])   . Hypertension   . Stroke [facility])     PSH:  Past Surgical History:  Procedure Laterality Date  . APPENDECTOMY      Social History:  Social History   Socioeconomic History  . Marital status: Married    Spouse name: Not on file  . Number of children: Not on file  . Years of education: Not on file  . Highest education level: Not on file  Occupational History  . Not on file  Tobacco Use  . Smoking status: Never Smoker  . Smokeless tobacco: Never Used  Substance and Sexual Activity  . Alcohol use: Never  . Drug use: Never  .  Sexual activity: Not on file  Other Topics Concern  . Not on file  Social History Narrative  . Not on file   Social Determinants of Health   Financial Resource Strain:   . Difficulty of Paying Living Expenses: Not on file  Food  Insecurity:   . Worried About Programme researcher, broadcasting/film/video in the Last Year: Not on file  . Ran Out of Food in the Last Year: Not on file  Transportation Needs:   . Lack of Transportation (Medical): Not on file  . Lack of Transportation (Non-Medical): Not on file  Physical Activity:   . Days of Exercise per Week: Not on file  . Minutes of Exercise per Session: Not on file  Stress:   . Feeling of Stress : Not on file  Social Connections:   . Frequency of Communication with Friends and Family: Not on file  . Frequency of Social Gatherings with Friends and Family: Not on file  . Attends Religious Services: Not on file  . Active Member of Clubs or Organizations: Not on file  . Attends Banker Meetings: Not on file  . Marital Status: Not on file  Intimate Partner Violence:   . Fear of Current or Ex-Partner: Not on file  . Emotionally Abused: Not on file  . Physically Abused: Not on file  . Sexually Abused: Not on file    Family History:  Family History  Problem Relation Age of Onset  . CAD Father        died at age 64  . Atrial fibrillation Neg Hx     Medications:   Current Outpatient Medications on File Prior to Visit  Medication Sig Dispense Refill  . acetaminophen (TYLENOL) 325 MG tablet Take 650 mg by mouth every 6 (six) hours as needed for mild pain, fever or headache.    Marland Kitchen ascorbic acid (VITAMIN C) 1000 MG tablet Take by mouth.    Marland Kitchen aspirin EC 81 MG EC tablet Take 1 tablet (81 mg total) by mouth daily. 30 tablet 1  . calcium carbonate (TUMS - DOSED IN MG ELEMENTAL CALCIUM) 500 MG chewable tablet Chew 1 tablet by mouth 3 (three) times daily as needed for indigestion or heartburn.    . cholecalciferol (VITAMIN D3) 25 MCG (1000 UT) tablet Take 1 tablet (1,000 Units total) by mouth daily after breakfast. 30 tablet 1  . lisinopril (ZESTRIL) 30 MG tablet Take 30 mg by mouth daily.    Marland Kitchen loratadine (CLARITIN) 10 MG tablet Take 1 tablet (10 mg total) by mouth daily. 30 tablet 1    . metoprolol succinate (TOPROL-XL) 50 MG 24 hr tablet Take 2 tablets by mouth 2 (two) times daily at 10 AM and 5 PM.    . omega-3 acid ethyl esters (LOVAZA) 1 g capsule Take 1 capsule (1,000 mg total) by mouth daily. 30 capsule 1  . simvastatin (ZOCOR) 40 MG tablet Take 40 mg by mouth at bedtime.      No current facility-administered medications on file prior to visit.    Allergies:   Allergies  Allergen Reactions  . Erythromycin Hives     Physical Exam  Vitals:   05/20/19 1301  BP: 131/74  Pulse: 90  Temp: 97.8 F (36.6 C)  TempSrc: Oral  Weight: 146 lb (66.2 kg)  Height: 5\' 2"  (1.575 m)   Body mass index is 26.7 kg/m. No exam data present   General: well developed, well nourished,  very pleasant elderly Caucasian female, seated,  in no evident distress Head: head normocephalic and atraumatic.   Neck: supple with no carotid or supraclavicular bruits Cardiovascular: regular rate and rhythm, no murmurs Musculoskeletal: no deformity Skin:  no rash/petichiae Vascular:  Normal pulses all extremities   Neurologic Exam Mental Status: Awake and fully alert.   Normal speech and language.  Oriented to place and time. Recent and remote memory intact. Attention span, concentration and fund of knowledge appropriate. Mood and affect appropriate.  Cranial Nerves: Fundoscopic exam reveals sharp disc margins. Pupils equal, briskly reactive to light. Extraocular movements full without nystagmus. Visual fields full to confrontation. Hearing intact. Facial sensation intact. Face, tongue, palate moves normally and symmetrically.  Motor: Normal bulk and tone. Normal strength in all tested extremity muscles except mild hip flexor weakness and decreased left hand dexterity. Sensory.: intact to touch , pinprick , position and vibratory sensation.  Coordination: Rapid alternating movements normal in all extremities except decreased left hand. Finger-to-nose and heel-to-shin performed accurately  bilaterally. Gait and Station: Arises from chair without difficulty. Stance is normal. Gait demonstrates normal stride length and balance with use of Rollator walker. Reflexes: 1+ and symmetric. Toes downgoing.     NIHSS  0 Modified Rankin  2 CHA2DS2-VASc 7 HAS-BLED 3   Diagnostic Data (Labs, Imaging, Testing)  CT HEAD WO CONTRAST 02/21/2019 IMPRESSION: 1. Focal parenchymal hemorrhage involving the anterior genu of the corpus callosum bilaterally, left greater than right, with posterior extension along the body of the left corpus callosum. Mild localized edema without significant regional mass effect. Finding of uncertain etiology, with primary differential considerations including an acute hemorrhagic infarct, atypical hypertensive hemorrhage, or coagulopathic bleed. No visible underlying mass lesion or vascular malformation. No other findings to suggest acute traumatic injury. 2. Moderately advanced age-related cerebral atrophy with chronic small vessel ischemic disease and remote right basal ganglia lacunar infarct.  ECHOCARDIOGRAM 02/23/2019 IMPRESSIONS: 1. Left ventricular ejection fraction, by visual estimation, is 55 to  60%. The left ventricle has normal function. There is no left ventricular  hypertrophy.  2. The left ventricle has no regional wall motion abnormalities.  3. Global right ventricle has normal systolic function.The right  ventricular size is normal. No increase in right ventricular wall  thickness.  4. Left atrial size was mildly dilated.  5. Right atrial size was normal.  6. Moderate calcification of the mitral valve leaflet(s).  7. Moderate thickening of the mitral valve leaflet(s).  8. Severe mitral annular calcification.  9. The mitral valve is abnormal. Mild mitral valve regurgitation.  Mild-moderate mitral stenosis.  10. Mitral stenosis mild by pressure half time/mitral valve area, moderate  by gradient. No prior for comparison.  11.  The tricuspid valve is normal in structure. Tricuspid valve  regurgitation is trivial.  12. The aortic valve was not well visualized. Aortic valve regurgitation  is trivial. Mild aortic valve stenosis.  13. The pulmonic valve was not well visualized. Pulmonic valve  regurgitation is not visualized.  14. The inferior vena cava is dilated in size with >50% respiratory  variability, suggesting right atrial pressure of 8 mmHg.  15. The atrial septum is grossly normal.       ASSESSMENT: Kimberly Mullins is a 86 y.o. 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 Vascular risk factors include HTN, HLD, DM, prior stroke, new onset/?known atrial fibrillation, mitral stenosis and advanced age.  Recovered well  from a stroke standpoint with residual mild left hemiparesis    PLAN:  1. Hypertensive hemorrhage: Continue aspirin 81 mg daily at this time but as she is 3 months out from prior stroke, recommend restarting warfarin for secondary stroke prevention.  Initial prescribing will be deferred to cardiology.  Once initiated, aspirin can be discontinued from a neurological standpoint.  Continue simvastatin for secondary stroke prevention.  Maintain strict control of hypertension with blood pressure goal below 130/90, diabetes with hemoglobin A1c goal below 6.5% and cholesterol with LDL cholesterol (bad cholesterol) goal below 70 mg/dL.  I also advised the patient to eat a healthy diet with plenty of whole grains, cereals, fruits and vegetables, exercise regularly with at least 30 minutes of continuous activity daily and maintain ideal body weight. 2. HTN: Advised to continue current treatment regimen.  Today's BP 131/74.  Advised to continue to monitor at home along with continued follow-up with PCP for management 3. HLD:  Advised to continue current treatment regimen along with continued follow-up with PCP for future prescribing and monitoring of lipid panel 4. DMII: Daughter questioning restarting Metformin and defer to PCP 5. Atrial fibrillation: From a neurological standpoint, clear to restart warfarin for secondary stroke prevention.  Advised to follow-up with cardiology and A. fib clinic for ongoing monitoring and management. 6. Left hemiparesis, poststroke: Continuation of PT/OT for ongoing improvement    Follow up in 3 months or call earlier if needed   Greater than 50% of time during this 45 minute visit was spent on counseling, explanation of diagnosis of hypertensive hemorrhage, reviewing risk factor management of HTN, HLD, DM, atrial fibrillation, discussion regarding risks and benefits of restarting anticoagulation, planning of further management along with potential future management, and discussion with patient and family answering all questions.    Ihor Austin, AGNP-BC  [facility] [address] [place], [place] [address]  Phone [phone] Fax [phone] Note: This document was prepared with digital dictation and possible smart phrase technology. Any transcriptional errors that result from this process are unintentional.

## 2019-05-20 NOTE — Patient Instructions (Signed)
Continue aspirin 81 mg daily  and simvastatin  for secondary stroke prevention  You are cleared to return back to taking warfarin - will notify cardiology regarding restarting warfarin. Once restarted, you can stop aspirin 81mg   Continue to follow up with PCP regarding cholesterol, blood pressure and diabetes management   Continue to monitor blood pressure at home  Maintain strict control of hypertension with blood pressure goal below 130/90, diabetes with hemoglobin A1c goal below 6.5% and cholesterol with LDL cholesterol (bad cholesterol) goal below 70 mg/dL. I also advised the patient to eat a healthy diet with plenty of whole grains, cereals, fruits and vegetables, exercise regularly and maintain ideal body weight.  Followup in the future with me in 3 months or call earlier if needed       Thank you for coming to see at [facility]. I hope we have been able to provide you high quality care today.  You may receive a patient satisfaction survey over the next few weeks. We would appreciate your feedback and comments so that we may continue to improve ourselves and the health of our patients.

## 2019-05-20 NOTE — Progress Notes (Signed)
I agree with the above plan 

## 2019-06-29 ENCOUNTER — Ambulatory Visit: Payer: Medicare Other | Admitting: Cardiology

## 2019-07-10 ENCOUNTER — Encounter: Payer: Self-pay | Admitting: Cardiology

## 2019-07-10 ENCOUNTER — Other Ambulatory Visit: Payer: Self-pay

## 2019-07-10 ENCOUNTER — Ambulatory Visit (INDEPENDENT_AMBULATORY_CARE_PROVIDER_SITE_OTHER): Payer: Medicare Other | Admitting: Cardiology

## 2019-07-10 VITALS — BP 130/66 | HR 84 | Ht 62.0 in | Wt 144.2 lb

## 2019-07-10 DIAGNOSIS — I480 Paroxysmal atrial fibrillation: Secondary | ICD-10-CM | POA: Diagnosis not present

## 2019-07-10 DIAGNOSIS — I050 Rheumatic mitral stenosis: Secondary | ICD-10-CM | POA: Diagnosis not present

## 2019-07-10 DIAGNOSIS — I619 Nontraumatic intracerebral hemorrhage, unspecified: Secondary | ICD-10-CM

## 2019-07-10 DIAGNOSIS — I10 Essential (primary) hypertension: Secondary | ICD-10-CM

## 2019-07-10 NOTE — Progress Notes (Signed)
Cardiology Office Note:    Date:  07/10/2019   ID:  Kimberly Mullins, DOB [birth year], MRN [id number]  PCP:  Angelica Chessman, MD  Cardiologist:  Armanda Magic, MD    Referring MD: Angelica Chessman, MD   Chief Complaint  Patient presents with  . Hypertension  . Atrial Fibrillation    History of Present Illness:    Kimberly Mullins is a 86 y.o. female with a hx of rheumatic fever as a child, CVA in the past with mild left-sided residual deficits, hypertension, DM 2 who was seen in hospital consultation 02/25/2019 for the evaluation of mitral stenosis and atrial fibrillation with RVR.  Ms.Hunterreportedly has a history of cardiac thrombus in the 1990's for which she was started on Coumadin. She has been continued on Coumadin since that time due to prior stroke.  Patient presented to the ED via EMS on 02/20/2019 from her assisted living facility for further evaluation of bilateral leg weakness. Head CT showedsmall hemorrhage in the genu of the corpus callosum bilaterally with mild localized edema but no mass-effect or intraventricular hemorrhage.Systolic BP was greater than 180's in the ED and patient was started on Labetalol and Cardene drip. Home Coumadin was held. Echo on 02/23/2019 showed LVEF of 55-60% with normal wall motion, mild to moderate mitral stenosis with severe mitral annular calcification, mild aortic stenosis, and trace tricuspid regurgitation. She did well from a Neuro standpoint however, she went into atrial fibrilltion withrates as high as the 190's.she was started on IV Cardizem with cardiology consultation.  EKG showed atrial fibrillation with diffuse ST depression with repeat showing NSR and no ST changes.  On echocardiogram had evidence of rheumatic heart disease with rheumatic MS and AS. Plan per neurology was for one month Coumadin hold>>possible restart 03/30/2019. Given her EKG changes plan was for nuclear stress testing which was performed 03/09/2019 that was  normal.   She was most recently seen in the atrial fibrillation clinic at which time she continued to not be anticoagulated per neurology's request.  She was in normal sinus rhythm.  She was continued on Toprol-XL 75 mg p.o. daily.  She was most recently seen in afib clinic 04/22/2019 and was maintaining NSR.  She has remained off anticoagulation until Neuro gives the ok to restart.     Past Medical History:  Diagnosis Date  . Diabetes mellitus without complication (HCC)   . Hypertension   . Stroke [facility])     Past Surgical History:  Procedure Laterality Date  . APPENDECTOMY      Current Medications: Current Meds  Medication Sig  . acetaminophen (TYLENOL) 325 MG tablet Take 650 mg by mouth every 6 (six) hours as needed for mild pain, fever or headache.  Marland Kitchen ascorbic acid (VITAMIN C) 1000 MG tablet Take by mouth.  . calcium carbonate (TUMS - DOSED IN MG ELEMENTAL CALCIUM) 500 MG chewable tablet Chew 1 tablet by mouth 3 (three) times daily as needed for indigestion or heartburn.  . cholecalciferol (VITAMIN D3) 25 MCG (1000 UT) tablet Take 1 tablet (1,000 Units total) by mouth daily after breakfast.  . fenofibrate micronized (LOFIBRA) 134 MG capsule daily.  Marland Kitchen lisinopril (ZESTRIL) 30 MG tablet Take 30 mg by mouth daily.  Marland Kitchen loratadine (CLARITIN) 10 MG tablet Take 1 tablet (10 mg total) by mouth daily.  . metoprolol succinate (TOPROL-XL) 50 MG 24 hr tablet Take 2 tablets by mouth 2 (two) times daily at 10 AM and 5 PM.  . omega-3 acid ethyl  esters (LOVAZA) 1 g capsule Take 1 capsule (1,000 mg total) by mouth daily.  Marland Kitchen oxybutynin (DITROPAN) 5 MG tablet daily.  . simvastatin (ZOCOR) 40 MG tablet Take 40 mg by mouth at bedtime.   . traMADol (ULTRAM) 50 MG tablet as needed.     Allergies:   Erythromycin   Social History   Socioeconomic History  . Marital status: Married    Spouse name: Not on file  . Number of children: Not on file  . Years of education: Not on file  . Highest education  level: Not on file  Occupational History  . Not on file  Tobacco Use  . Smoking status: Never Smoker  . Smokeless tobacco: Never Used  Substance and Sexual Activity  . Alcohol use: Never  . Drug use: Never  . Sexual activity: Not on file  Other Topics Concern  . Not on file  Social History Narrative  . Not on file   Social Determinants of Health   Financial Resource Strain:   . Difficulty of Paying Living Expenses:   Food Insecurity:   . Worried About Charity fundraiser in the Last Year:   . Arboriculturist in the Last Year:   Transportation Needs:   . Film/video editor (Medical):   Marland Kitchen Lack of Transportation (Non-Medical):   Physical Activity:   . Days of Exercise per Week:   . Minutes of Exercise per Session:   Stress:   . Feeling of Stress :   Social Connections:   . Frequency of Communication with Friends and Family:   . Frequency of Social Gatherings with Friends and Family:   . Attends Religious Services:   . Active Member of Clubs or Organizations:   . Attends Archivist Meetings:   Marland Kitchen Marital Status:      Family History: The patient's family history includes CAD in her father. There is no history of Atrial fibrillation.  ROS:   Please see the history of present illness.    ROS  All other systems reviewed and negative.   EKGs/Labs/Other Studies Reviewed:    The following studies were reviewed today: Office notes from [facility]  EKG:  EKG is not ordered today.    Recent Labs: 02/25/2019: Magnesium 1.5; TSH 1.297 02/27/2019: BUN 12; Creatinine, Ser 0.61; Hemoglobin 9.6; Platelets 239; Potassium 3.3; Sodium 140   Recent Lipid Panel    Component Value Date/Time   CHOL 155 02/24/2019 0506   TRIG 86 02/24/2019 0506   HDL 60 02/24/2019 0506   CHOLHDL 2.6 02/24/2019 0506   VLDL 17 02/24/2019 0506   LDLCALC 78 02/24/2019 0506    Physical Exam:    VS:  There were no vitals taken for this visit.    Wt Readings from Last 3 Encounters:    05/20/19 146 lb (66.2 kg)  04/22/19 149 lb (67.6 kg)  03/11/19 150 lb 9.6 oz (68.3 kg)     GEN:  Well nourished, well developed in no acute distress HEENT: Normal NECK: No JVD; No carotid bruits LYMPHATICS: No lymphadenopathy CARDIAC: RRR, no murmurs, rubs, gallops RESPIRATORY:  Clear to auscultation without rales, wheezing or rhonchi  ABDOMEN: Soft, non-tender, non-distended MUSCULOSKELETAL:  No edema; No deformity  SKIN: Warm and dry NEUROLOGIC:  Alert and oriented x 3 PSYCHIATRIC:  Normal affect   ASSESSMENT:    1. PAF (paroxysmal atrial fibrillation) ([place])   2. Hemorrhagic cerebrovascular accident (CVA) ([place])   3. Rheumatic mitral stenosis   4.  Essential hypertension    PLAN:    In order of problems listed above:  1. New onset a-fib [facility]) -EKG performed on December 23 showed normal sinus rhythm with poor anterior R wave progression.   -she appears in NSR on exam today -continue Toprol XL 50mg  2 tabs BID -continue Warfarin followed by Neuro  2. Hemorrhagic cerebrovascular accident (CVA) (HCC) -s/p hemorrhagic CVA 02/2019 with CT of the head demonstrating small hemorrhage in the genu of the corpus callosum bilaterally with mild localized edema but no mass-effect or intraventricular hemorrhage.   -Patient denies any residual focal neurologic deficits.   -she is now back on anticoagulation with warfarin  3. Rheumatic mitral stenosis -Recent echo 02/2019 which showed severe mitral annular calcification, mild to moderate mitral stenosis and mild MR -she is asymptomatic -repeat 2D echo in 1 year  4. Essential hypertension -Bp controlled today on exam -continue Toprol XL 50mg  2 tab BID and Lisinopril 30mg  daily -creatinine was 0.61 and K+ 3.3 in 02/2019 -repeat BMET today   Medication Adjustments/Labs and Tests Ordered: Current medicines are reviewed at length with the patient today.  Concerns regarding medicines are outlined above.  No orders of the defined  types were placed in this encounter.  No orders of the defined types were placed in this encounter.   Signed, , MD  07/10/2019 11:48 AM    [facility]

## 2019-07-10 NOTE — Patient Instructions (Signed)
Medication Instructions:  Your physician recommends that you continue on your current medications as directed. Please refer to the Current Medication list given to you today.  *If you need a refill on your cardiac medications before your next appointment, please call your pharmacy*  Follow-Up: At [facility], you and your health needs are our priority.  As part of our continuing mission to provide you with exceptional heart care, we have created designated Provider Care Teams.  These Care Teams include your primary Cardiologist (physician) and Advanced Practice Providers (APPs -  Physician Assistants and Nurse Practitioners) who all work together to provide you with the care you need, when you need it.  We recommend signing up for the patient portal called "MyChart".  Sign up information is provided on this After Visit Summary.  MyChart is used to connect with patients for Virtual Visits (Telemedicine).  Patients are able to view lab/test results, encounter notes, upcoming appointments, etc.  Non-urgent messages can be sent to your provider as well.   To learn more about what you can do with MyChart, go to [email or web address].    Your next appointment:   6 month(s)  The format for your next appointment:   In Person  Provider:   You may see Traci Turner, MD or one of the following Advanced Practice Providers on your designated Care Team:    Dayna Dunn, PA-C  Michele Lenze, PA-C   

## 2019-09-09 ENCOUNTER — Ambulatory Visit: Payer: Medicare Other | Admitting: Adult Health

## 2019-09-14 ENCOUNTER — Other Ambulatory Visit: Payer: Self-pay

## 2019-09-14 ENCOUNTER — Encounter: Payer: Self-pay | Admitting: Adult Health

## 2019-09-14 ENCOUNTER — Ambulatory Visit (INDEPENDENT_AMBULATORY_CARE_PROVIDER_SITE_OTHER): Payer: Medicare Other | Admitting: Adult Health

## 2019-09-14 VITALS — BP 138/88 | HR 80 | Ht 61.0 in | Wt 143.0 lb

## 2019-09-14 DIAGNOSIS — E7801 Familial hypercholesterolemia: Secondary | ICD-10-CM | POA: Diagnosis not present

## 2019-09-14 DIAGNOSIS — I610 Nontraumatic intracerebral hemorrhage in hemisphere, subcortical: Secondary | ICD-10-CM | POA: Diagnosis not present

## 2019-09-14 DIAGNOSIS — I10 Essential (primary) hypertension: Secondary | ICD-10-CM

## 2019-09-14 DIAGNOSIS — I480 Paroxysmal atrial fibrillation: Secondary | ICD-10-CM | POA: Diagnosis not present

## 2019-09-14 DIAGNOSIS — E119 Type 2 diabetes mellitus without complications: Secondary | ICD-10-CM

## 2019-09-14 NOTE — Progress Notes (Signed)
I agree with the above plan 

## 2019-09-14 NOTE — Progress Notes (Signed)
[facility] [address] [place]. [place] [address] [phone]       STROKE FOLLOW UP NOTE  Ms. Kimberly Mullins Date of Birth:  [birth year] Medical Record Number:  [id number]   Reason for Referral: stroke follow up    CHIEF COMPLAINT:  Chief Complaint  Patient presents with  . Follow-up    rm 9 here for a stroke f/u. Pt has no new sx.    HPI:  Today, 09/14/2019, Kimberly Mullins is being seen for 25-month stroke follow-up accompanied by her daughter.  She has been stable since prior visit without residual deficits and denies new stroke/TIA symptoms. She has since been restarted on warfarin with routine INR level monitoring and denies bleeding or bruising.  Currently managed by cardiology.  Continues on simvastatin, fenofibrate and omega-3 for HLD management without myalgias.  Blood pressure today initially elevated and on recheck 138/88.  No concerns at this time.  History provided for reference purposes only Initial visit 05/20/2019 JM: Kimberly Mullins is a 86 year old female who is being seen today for hospital follow-up accompanied by her daughter.  Residual stroke deficits include mild left sided weakness with ongoing improvement.  Denies residual memory concerns or cognitive concerns.  Has since been discharged from [facility] SNF rehab on 03/18/2019 back to independent living at [facility] [place]. She has been participating with PT/OT.  Anticoagulation therapy has not been restarted at this time and continues on aspirin without bleeding or bruising.  Cardiology is currently awaiting neurology clearance prior to restarting.  Continues on simvastatin without myalgias. Blood pressure today 131/74.  Daughter concerned regarding discontinuing of Metformin at hospitalization for unknown reasons.  No further concerns at this time.  Stroke admission 02/20/2019: Kimberly T Hunteris a lovely 86 y.o.femalewith history of hypertension, diabetes mellitus, previous stroke  with mild left-sided residual deficits, and Coumadin PTA (INR 2.0) presented on 02/20/2019 to the emergency department with generalized weakness and difficulty walking. Evaluated by stroke team and Dr. 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  Initially question use of warfarin but was found to have atrial fibrillation with RVR (unable to determine prior history) and recommended aspirin 81 mg daily at discharge and possibly restarting warfarin 1 month after discharge.  2D echo normal EF but did show moderate mitral stenosis and also possible reason for chronic warfarin use.  Found in hypertensive emergency on arrival stabilized during admission and recommended BP goal normotensive range.  LDL 78 and recommended continuation of home statin at discharge.  Controlled DM with A1c 6.1.  Other stroke risk factors include advanced age and prior history of right BG stroke.  Other active problems include possible aspiration pneumonia, anemia and hypokalemia.  Discharge to SNF rehab on 02/27/2019 for ongoing therapies with residual cognitive impairment, and mild left-sided weakness.  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  Sars Corona Virus 2 - negative  LDL - 78   HgbA1c- 6.1  UDS -negative  VTE prophylaxis - SCDs  warfarin dailyprior to admission, now on No antithromboticgiven hemorrhage   Therapy recommendations: SNF  Disposition: SNF  Atrial Fibrillation w/ RVR  Possible reason for home anticoagulation: warfarin daily  Last INR 1.4  CHA2DS2-VASc Score = , ?2 oral anticoagulation  recommended Age in Years: ?36 +2  Sex: Female Female +1  Hypertension History: yes +1  Diabetes Mellitus: yes +1 Congestive Heart Failure History: yes +1 Vascular Disease History: yes +1  Stroke/TIA/Thromboembolism History: yes +2  Converted to NSR following cardizem gtt, now off  TSH normal  Remains tachycardic today - toprol increased to 75 by cardiology  Cardiology on board  Continue aspirin 81 mg dailyat discharge  Plan to resume warfarin in 1 month if remains stable - using warfarin d/t AF and hx Rheumatoid fever w/ MV stenosis       ROS:   14 system review of systems performed and negative with exception of gait impairment  PMH:  Past Medical History:  Diagnosis Date  . Diabetes mellitus without complication (HCC)   . Hypertension   . Stroke [facility])     PSH:  Past Surgical History:  Procedure Laterality Date  . APPENDECTOMY      Social History:  Social History   Socioeconomic History  . Marital status: Married    Spouse name: Not on file  . Number of children: Not on file  . Years of education: Not on file  . Highest education level: Not on file  Occupational History  . Not on file  Tobacco Use  . Smoking status: Never Smoker  . Smokeless tobacco: Never Used  Substance and Sexual Activity  . Alcohol use: Never  . Drug use: Never  . Sexual activity: Not on file  Other Topics Concern  . Not on file  Social History Narrative  . Not on file   Social Determinants of Health   Financial Resource Strain:   . Difficulty of Paying Living Expenses:   Food Insecurity:   . Worried About Programme researcher, broadcasting/film/video in the Last Year:   . Barista in the Last Year:   Transportation Needs:   . Freight forwarder (Medical):   Marland Kitchen Lack of Transportation (Non-Medical):   Physical Activity:   . Days of Exercise per  Week:   . Minutes of Exercise per Session:   Stress:   . Feeling of Stress :   Social Connections:   . Frequency of Communication with Friends and Family:   . Frequency of Social Gatherings with Friends and Family:   . Attends Religious Services:   . Active Member of Clubs or Organizations:   . Attends Banker Meetings:   Marland Kitchen Marital Status:   Intimate Partner Violence:   . Fear of Current or Ex-Partner:   . Emotionally Abused:   Marland Kitchen Physically Abused:   . Sexually Abused:     Family History:  Family History  Problem Relation Age of Onset  . CAD Father        died at age 87  . Atrial fibrillation Neg Hx     Medications:   Current Outpatient Medications on File Prior to Visit  Medication Sig Dispense Refill  . acetaminophen (TYLENOL) 325 MG tablet Take 650 mg by mouth every 6 (six) hours as needed for mild pain, fever or headache.    Marland Kitchen ascorbic acid (VITAMIN C) 1000 MG tablet Take by mouth.    . calcium carbonate (TUMS - DOSED IN MG ELEMENTAL CALCIUM) 500 MG chewable tablet Chew 1 tablet by mouth 3 (three) times daily as needed for indigestion or heartburn.    . cholecalciferol (VITAMIN D3) 25 MCG (1000 UT) tablet Take 1 tablet (1,000 Units total)  by mouth daily after breakfast. 30 tablet 1  . fenofibrate micronized (LOFIBRA) 134 MG capsule daily.    Marland Kitchen lisinopril (ZESTRIL) 30 MG tablet Take 30 mg by mouth daily.    Marland Kitchen loratadine (CLARITIN) 10 MG tablet Take 1 tablet (10 mg total) by mouth daily. 30 tablet 1  . metoprolol succinate (TOPROL-XL) 50 MG 24 hr tablet Take 50 mg by mouth daily. Take with or immediately following a meal.    . omega-3 acid ethyl esters (LOVAZA) 1 g capsule Take 1 capsule (1,000 mg total) by mouth daily. 30 capsule 1  . oxybutynin (DITROPAN) 5 MG tablet daily.    . simvastatin (ZOCOR) 40 MG tablet Take 40 mg by mouth at bedtime.     Marland Kitchen warfarin (COUMADIN) 5 MG tablet Take 5 mg by mouth daily. I tab daily except tues, thur, sat - 1/2 tab     No  current facility-administered medications on file prior to visit.    Allergies:   Allergies  Allergen Reactions  . Erythromycin Hives  . Toradol [Ketorolac Tromethamine]     Pt was hallucinations.      Physical Exam  Vitals:   09/14/19 1511 09/14/19 1537  BP: (!) 155/86 138/88  Pulse: 80   Weight:  143 lb (64.9 kg)  Height:  5\' 1"  (1.549 m)   Body mass index is 27.02 kg/m. No exam data present   General: well developed, well nourished,  very pleasant elderly Caucasian female, seated, in no evident distress Head: head normocephalic and atraumatic.   Neck: supple with no carotid or supraclavicular bruits Cardiovascular: regular rate and rhythm, no murmurs Musculoskeletal: no deformity Skin:  no rash/petichiae Vascular:  Normal pulses all extremities   Neurologic Exam Mental Status: Awake and fully alert.  Normal speech and language.  Oriented to place and time. Recent and remote memory intact. Attention span, concentration and fund of knowledge appropriate. Mood and affect appropriate.  Cranial Nerves: Pupils equal, briskly reactive to light. Extraocular movements full without nystagmus. Visual fields full to confrontation. Hearing intact. Facial sensation intact. Face, tongue, palate moves normally and symmetrically.  Motor: Normal bulk and tone. Normal strength in all tested extremity muscles  Sensory.: intact to touch , pinprick , position and vibratory sensation.  Coordination: Rapid alternating movements normal in all extremities except decreased left hand. Finger-to-nose and heel-to-shin performed accurately bilaterally. Gait and Station: Arises from chair without difficulty. Stance is hunched. Gait demonstrates normal stride length and balance with use of Rollator walker. Reflexes: 1+ and symmetric. Toes downgoing.       ASSESSMENT/PLAN: Kimberly Mullins is a 86 y.o. 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 Vascular risk factors include HTN, HLD, DM, prior stroke, new onset/?known atrial fibrillation, mitral stenosis and advanced age.        1. Hypertensive hemorrhage:  -Recovering well without residual deficits -Continue warfarin daily and simvastatin, fenofibrate and omega-3 for secondary stroke prevention.  2. HTN:  -BP goal<130/90 -Stable -Management by PCP 3. HLD:  -LDL goal<70 -Continue simvastatin, fenofibrate and omega-3 -Prescribing and management by PCP 4. DMII:  -Not currently on diabetic regimen; diet controlled -Management by PCP 5. Atrial fibrillation:  -Continue warfarin currently managed by cardiology -Also has history of rheumatoid fever with MV stenosis -Continue to follow with cardiology for prescribing and management monitor levels    Routinely followed by  other providers and secondary stroke prevention managed by PCP.  May follow-up on an as-needed basis at this time but advised to call with any stroke related questions or concerns   I spent 30 minutes of face-to-face and non-face-to-face time with patient and daughter.  This included previsit chart review, lab review, study review, order entry, electronic health record documentation, patient education regarding history of ICH, importance of managing stroke risk factors and answered all questions to patient and daughter satisfaction   Ihor AustinJessica McCue, AGNP-BC  [facility] [address] [place], [place] [id number]  Phone [phone] Fax [phone] Note: This document was prepared with digital dictation and possible smart phrase technology. Any transcriptional errors that result from this process are unintentional.

## 2019-09-14 NOTE — Patient Instructions (Signed)
Continue warfarin daily  and simvastatin, fenofibrate and omega-3 for secondary stroke prevention  Continue to follow up with PCP regarding cholesterol and blood pressure management   Continue to follow with cardiology for atrial fibrillation and warfarin management  Maintain strict control of hypertension with blood pressure goal below 130/90, diabetes with hemoglobin A1c goal below 6.5% and cholesterol with LDL cholesterol (bad cholesterol) goal below 70 mg/dL. I also advised the patient to eat a healthy diet with plenty of whole grains, cereals, fruits and vegetables, exercise regularly and maintain ideal body weight.  As you have recovered well and doing well from a stroke standpoint, recommend follow-up on an as-needed basis.  Please do not hesitate to call office with any questions or concerns regarding your prior stroke or need for follow-up visit       Thank you for coming to see [place] at [facility]. I hope we have been able to provide you high quality care today.  You may receive a patient satisfaction survey over the next few weeks. We would appreciate your feedback and comments so that we may continue to improve ourselves and the health of our patients.

## 2020-04-13 ENCOUNTER — Other Ambulatory Visit (HOSPITAL_BASED_OUTPATIENT_CLINIC_OR_DEPARTMENT_OTHER): Payer: Self-pay | Admitting: Family Medicine

## 2020-04-13 DIAGNOSIS — Z1231 Encounter for screening mammogram for malignant neoplasm of breast: Secondary | ICD-10-CM

## 2020-04-25 ENCOUNTER — Ambulatory Visit (HOSPITAL_BASED_OUTPATIENT_CLINIC_OR_DEPARTMENT_OTHER): Payer: Medicare Other

## 2020-07-13 ENCOUNTER — Other Ambulatory Visit (HOSPITAL_BASED_OUTPATIENT_CLINIC_OR_DEPARTMENT_OTHER): Payer: Self-pay | Admitting: Family Medicine

## 2020-07-13 DIAGNOSIS — Z1231 Encounter for screening mammogram for malignant neoplasm of breast: Secondary | ICD-10-CM

## 2020-07-25 ENCOUNTER — Other Ambulatory Visit: Payer: Self-pay

## 2020-07-25 ENCOUNTER — Ambulatory Visit (HOSPITAL_BASED_OUTPATIENT_CLINIC_OR_DEPARTMENT_OTHER)
Admission: RE | Admit: 2020-07-25 | Discharge: 2020-07-25 | Disposition: A | Discharge status: Home / Self Care | Payer: Medicare Other | Source: Ambulatory Visit | Attending: Family Medicine | Admitting: Family Medicine

## 2020-07-25 ENCOUNTER — Encounter (HOSPITAL_BASED_OUTPATIENT_CLINIC_OR_DEPARTMENT_OTHER): Payer: Self-pay

## 2020-07-25 DIAGNOSIS — Z1231 Encounter for screening mammogram for malignant neoplasm of breast: Secondary | ICD-10-CM | POA: Insufficient documentation

## 2020-08-06 IMAGING — CT CT HEAD WITHOUT CONTRAST
4 of 5 series · 15 of 47 positions shown, 16 images · non-contrast
Comparison: May 07, 2016

CLINICAL DATA: Status post fall

EXAM:
CT HEAD WITHOUT CONTRAST
CT CERVICAL SPINE WITHOUT CONTRAST
TECHNIQUE: Multidetector CT imaging of the head and cervical spine was
performed following the standard protocol without intravenous
contrast. Multiplanar CT image reconstructions of the cervical spine
were also generated.

[Series 4: head 5.0 h30s · axial · 0.39mm/px · z∈[+1246,+1301]mm · 2 of 34 slices shown, 3 images]
[im 12/34  brain]
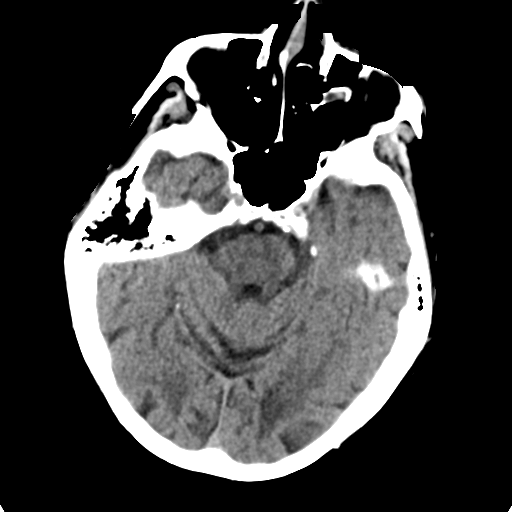
[im 12/34  bone]
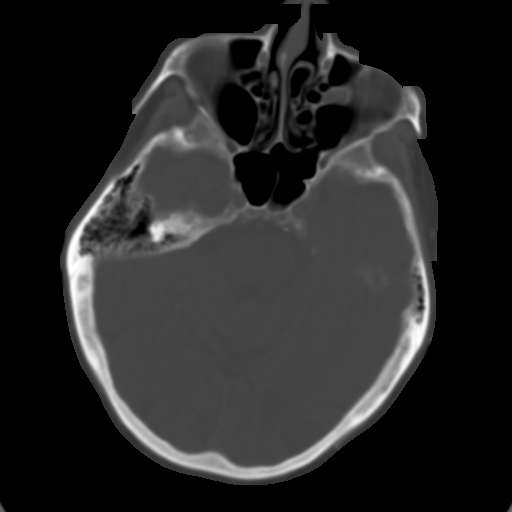
[im 23/34  brain]
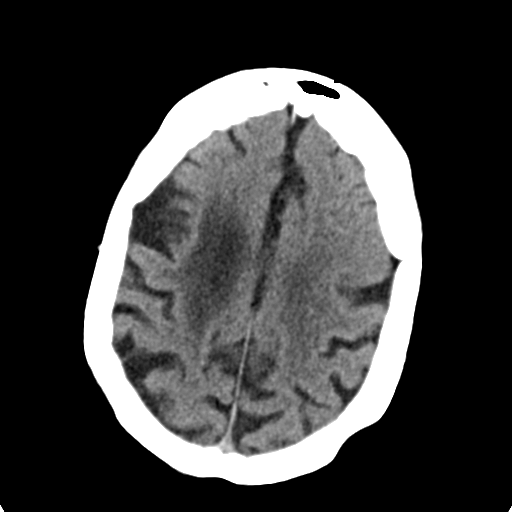

[Series 6: head 3.0 mpr cor · coronal · 0.33mm/px · 3 of 67 slices shown]
[im 23/67  brain]
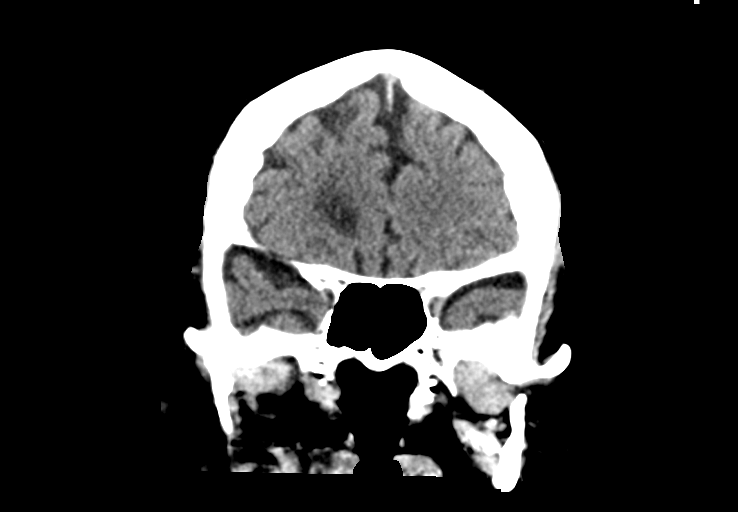
[im 30/67  brain]
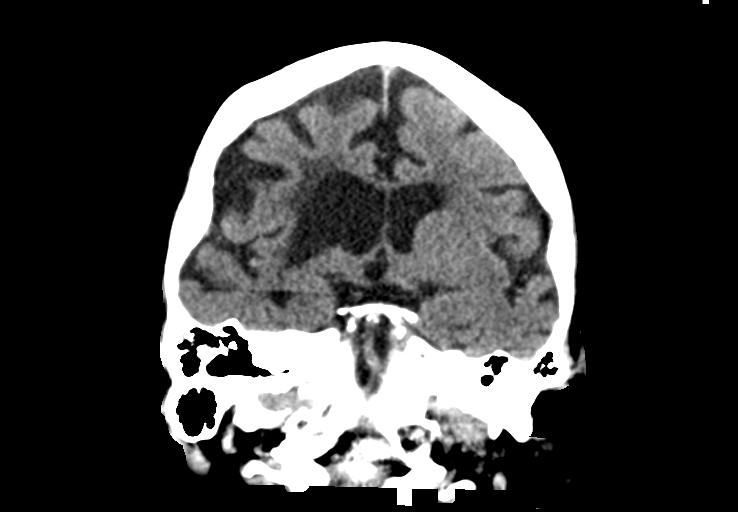
[im 37/67  brain]
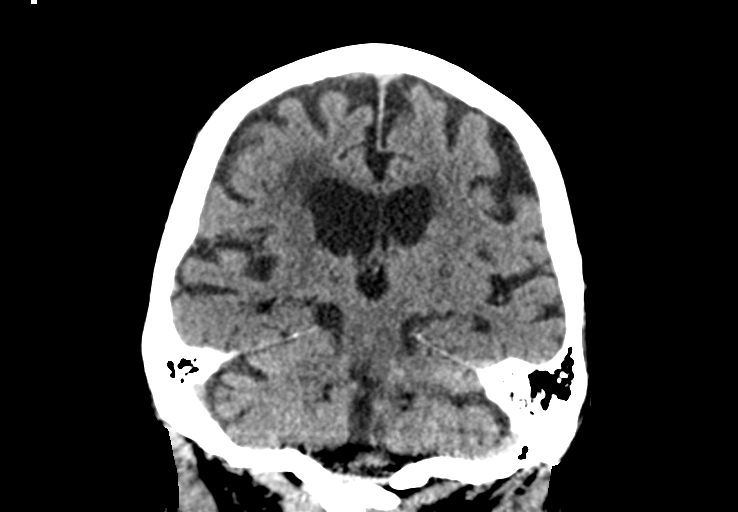

[Series 7: head 3.0 mpr sag · sagittal · 0.33mm/px · 3 of 67 slices shown]
[im 23/67  brain]
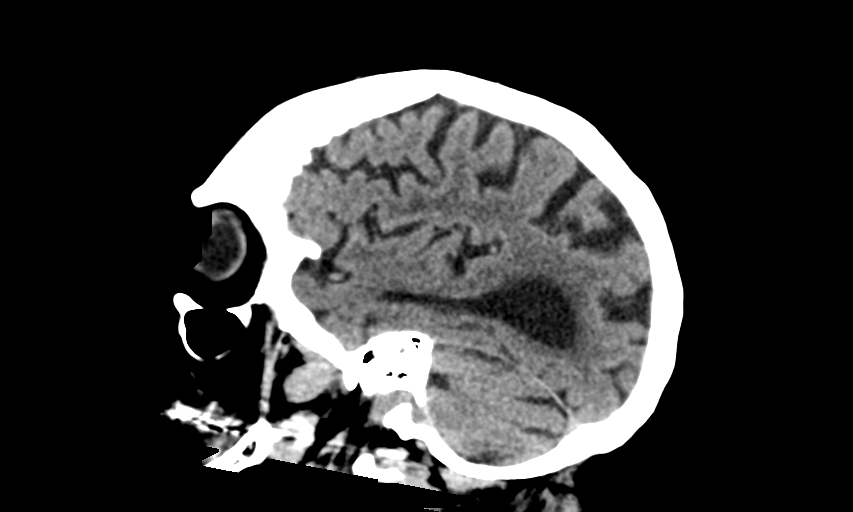
[im 34/67  brain]
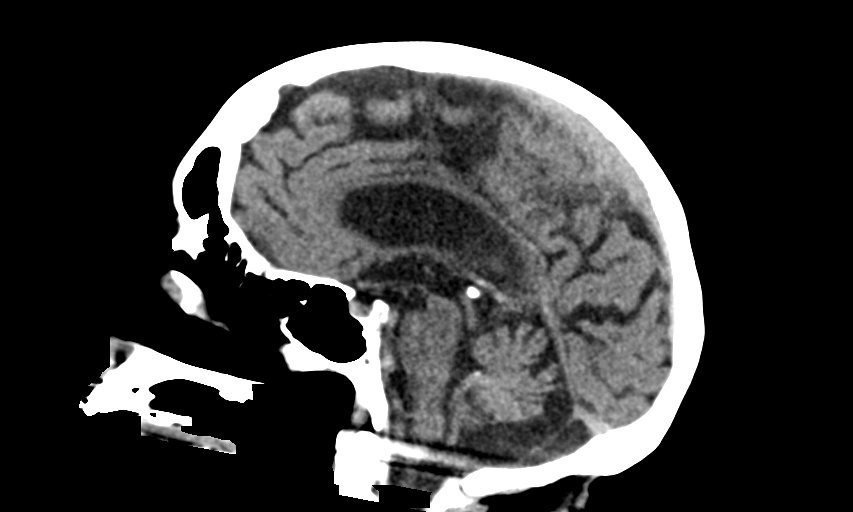
[im 45/67  brain]
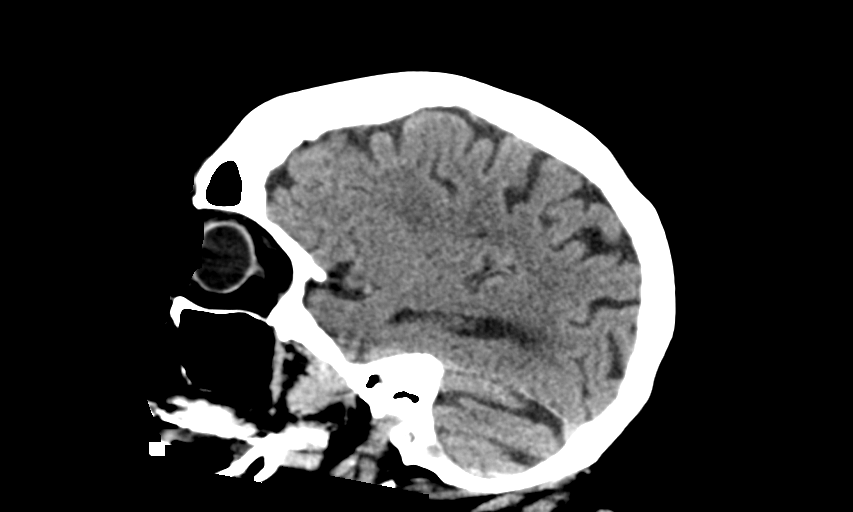

[Series 9: c_spine 2.0 st · axial · 0.45mm/px · z∈[+1088,+1208]mm · 7 of 91 slices shown]
[im 8/91  brain]
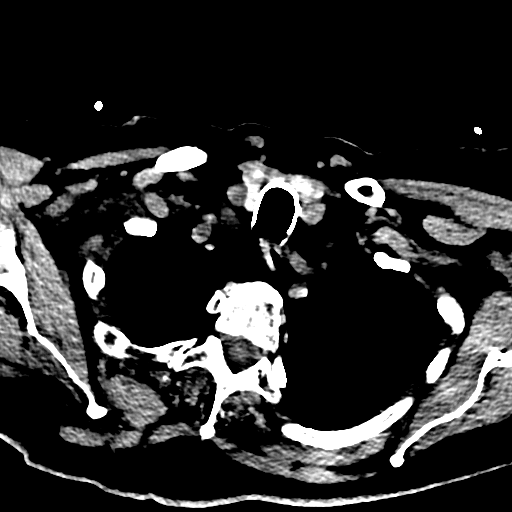
[im 23/91  brain]
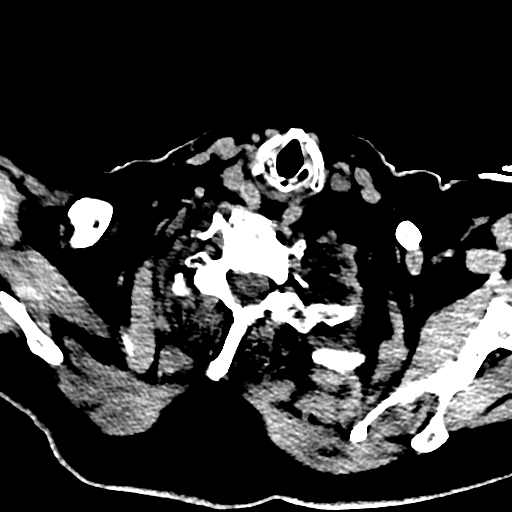
[im 31/91  brain]
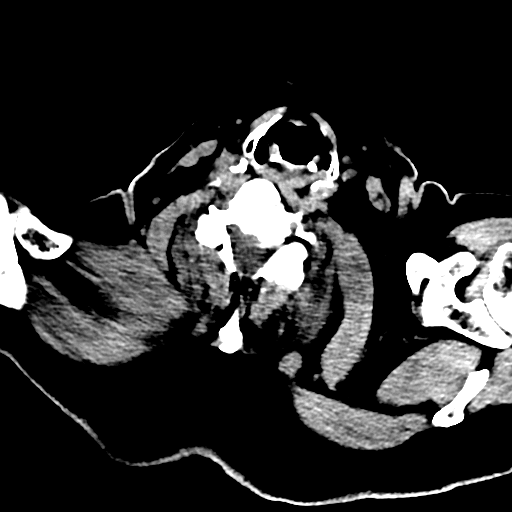
[im 38/91  brain]
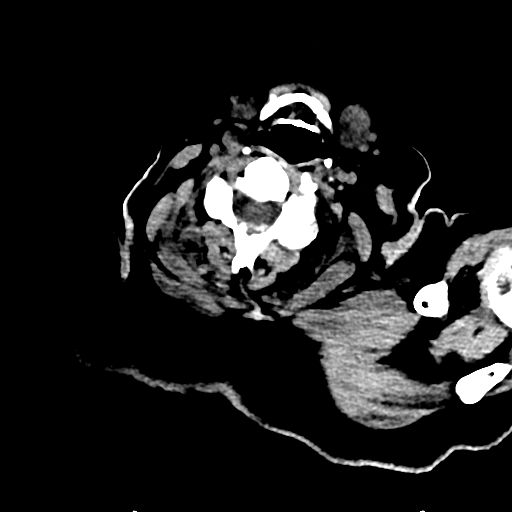
[im 53/91  brain]
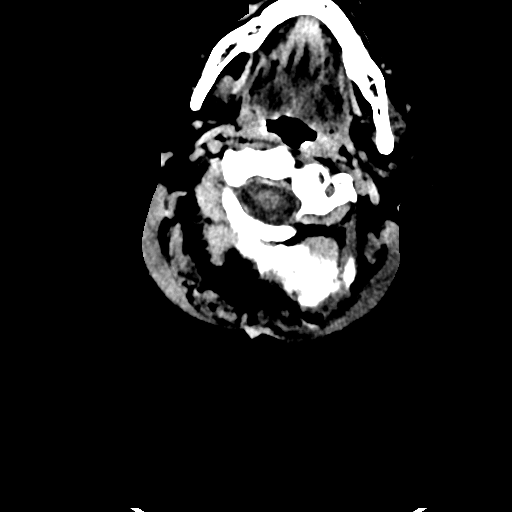
[im 61/91  brain]
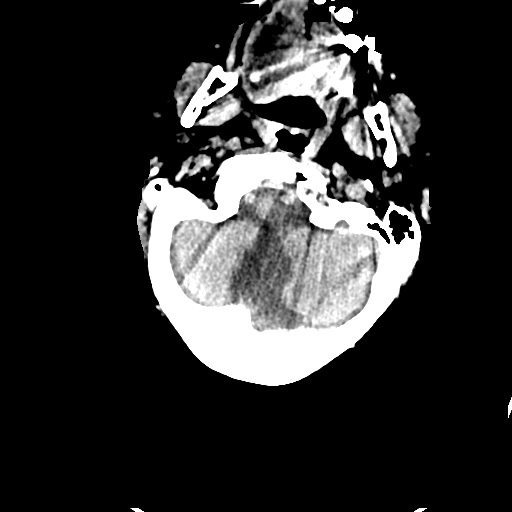
[im 68/91  brain]
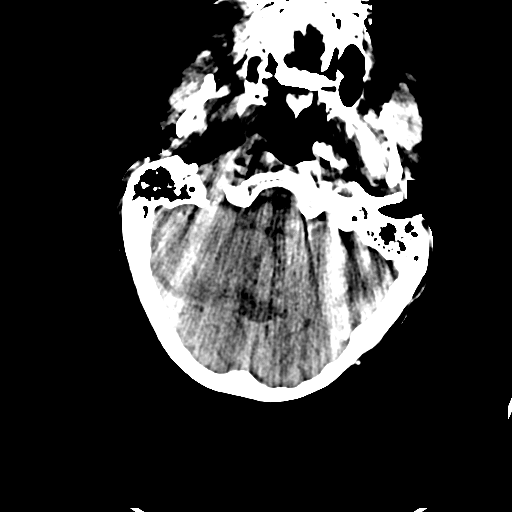

[15 of 47 positions shown; findings below may reference images not displayed]

FINDINGS: CT HEAD FINDINGS

Brain: No evidence of acute infarction, hemorrhage, hydrocephalus,
extra-axial collection or mass lesion/mass effect. Moderate brain
parenchymal volume loss and deep white matter microangiopathy.
Encephalomalacia from prior right periventricular subcortical
frontal infarct.

Vascular: No hyperdense vessel or unexpected calcification.

Skull: Normal. Negative for fracture or focal lesion.

Sinuses/Orbits: No acute finding.

Other: None.

CT CERVICAL SPINE FINDINGS

Alignment: Straightening of the cervical lordosis. Minimal
anterolisthesis of C4 on C5.

Skull base and vertebrae: No acute fracture. No primary bone lesion
or focal pathologic process.

Soft tissues and spinal canal: No prevertebral fluid or swelling. No
visible canal hematoma.

Disc levels: Multilevel osteoarthritic changes with disc space
narrowing, remodeling of vertebral bodies posterior facet
arthropathy.

Upper chest: Negative.

Other:
IMPRESSION: 1. No acute intracranial abnormality.
2. Atrophy, chronic microvascular disease. Old right
subcortical/periventricular frontal infarct.
3. No evidence of acute traumatic injury to the cervical spine.
4. Multilevel osteoarthritic changes of the cervical spine with
minimal anterolisthesis of C4 on C5.

## 2020-08-06 IMAGING — CT CT CERVICAL SPINE WITHOUT CONTRAST
3 of 4 series · 12 of 33 positions shown, 14 images · non-contrast
Comparison: May 07, 2016

CLINICAL DATA: Status post fall

EXAM:
CT HEAD WITHOUT CONTRAST
CT CERVICAL SPINE WITHOUT CONTRAST
TECHNIQUE: Multidetector CT imaging of the head and cervical spine was
performed following the standard protocol without intravenous
contrast. Multiplanar CT image reconstructions of the cervical spine
were also generated.

[Series 6: head 3.0 mpr cor · coronal · 0.33mm/px · 3 of 67 slices shown]
[im 14/67  bone]
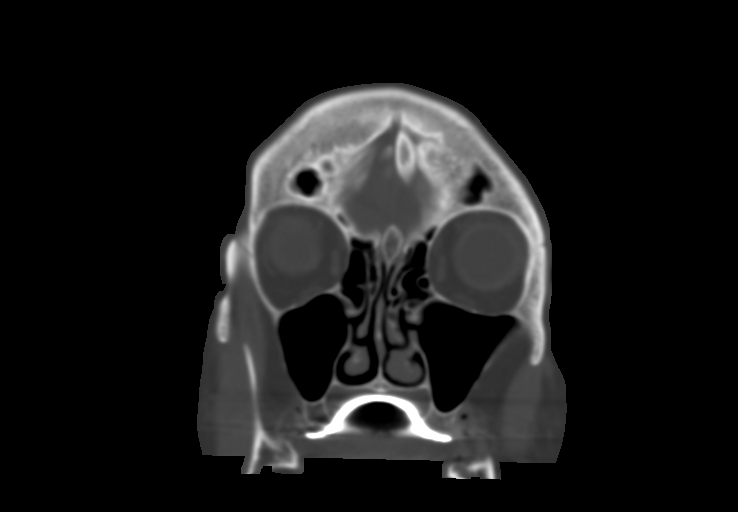
[im 27/67  bone]
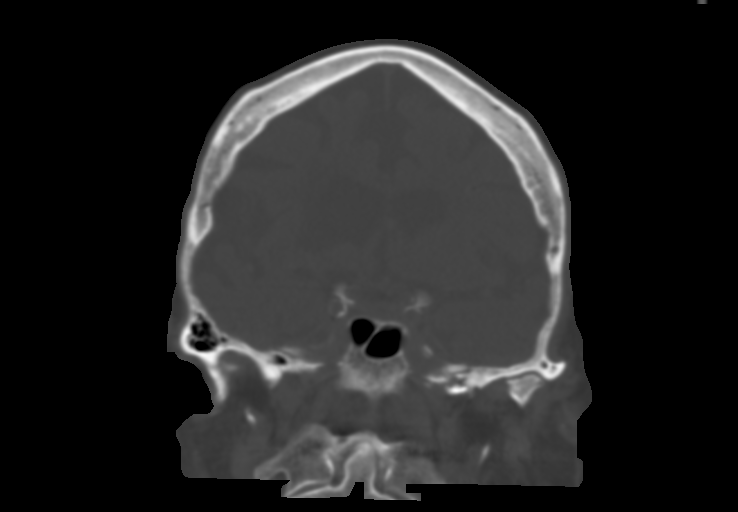
[im 40/67  bone]
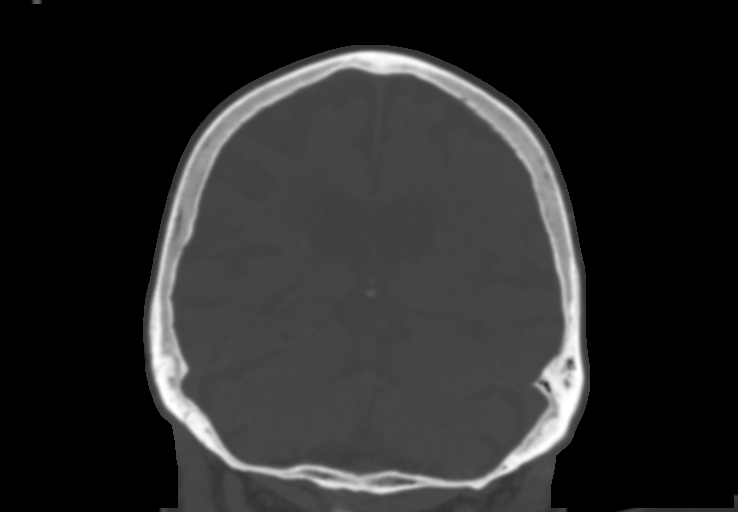

[Series 7: head 3.0 mpr sag · sagittal · 0.33mm/px · 5 of 67 slices shown, 6 images]
[im 23/67  bone]
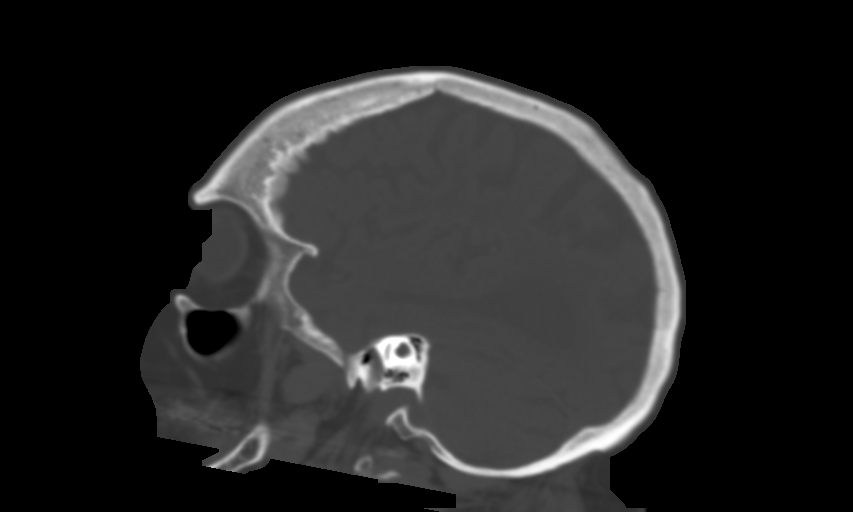
[im 28/67  bone]
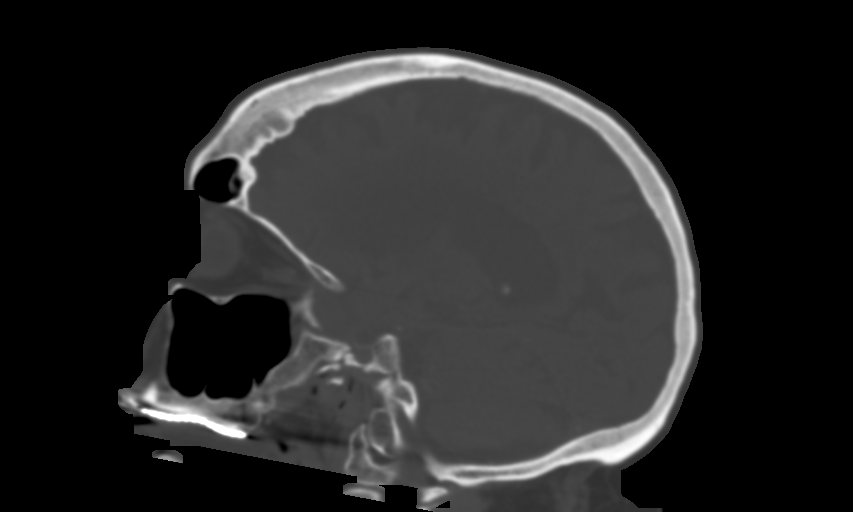
[im 34/67  soft-tissue]
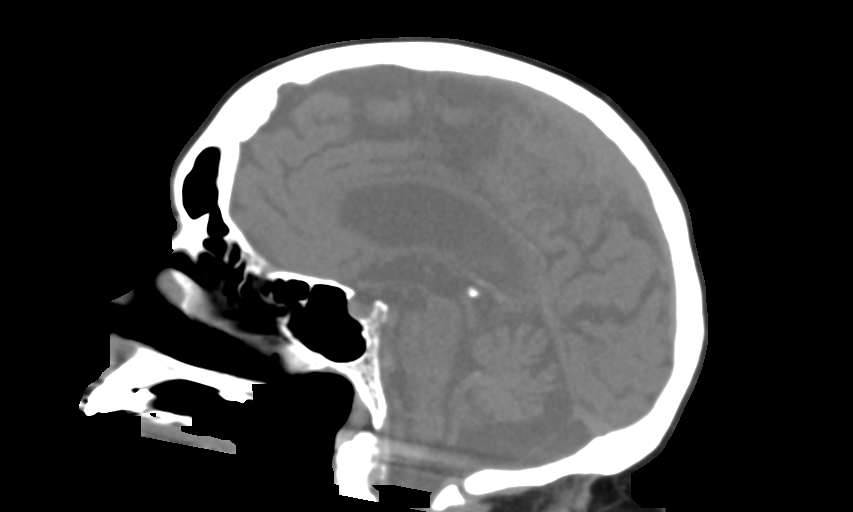
[im 34/67  bone]
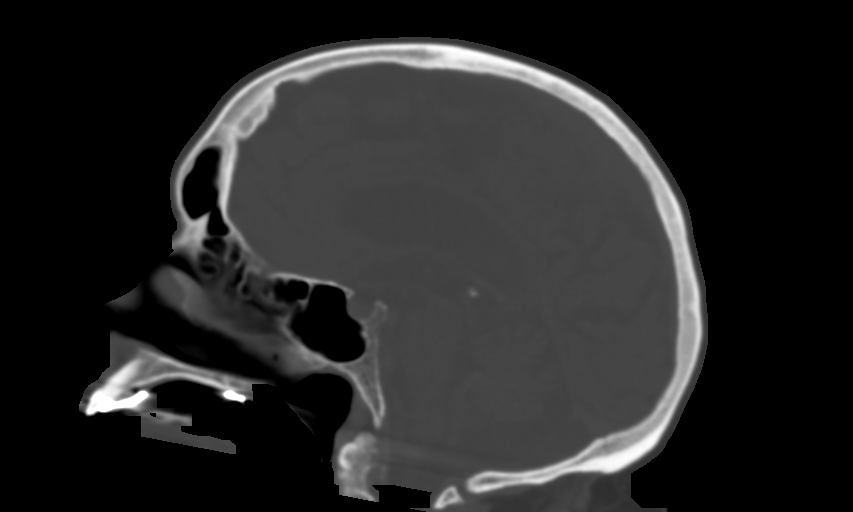
[im 39/67  bone]
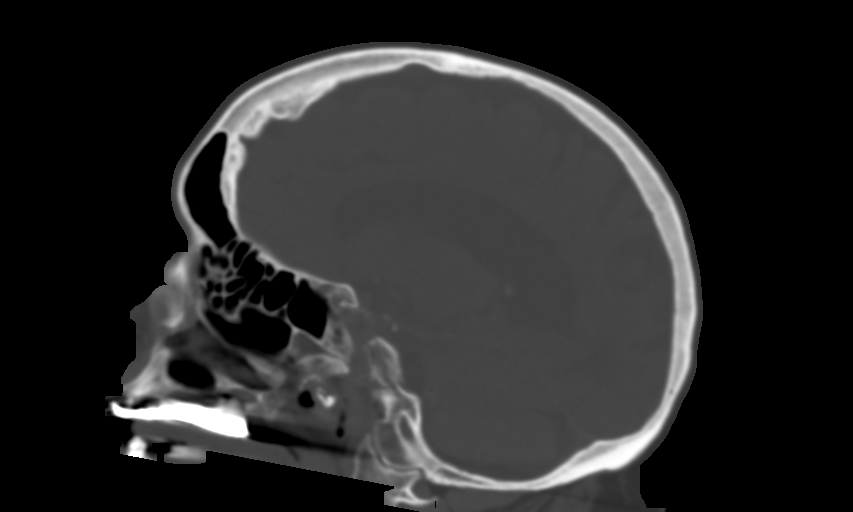
[im 45/67  bone]
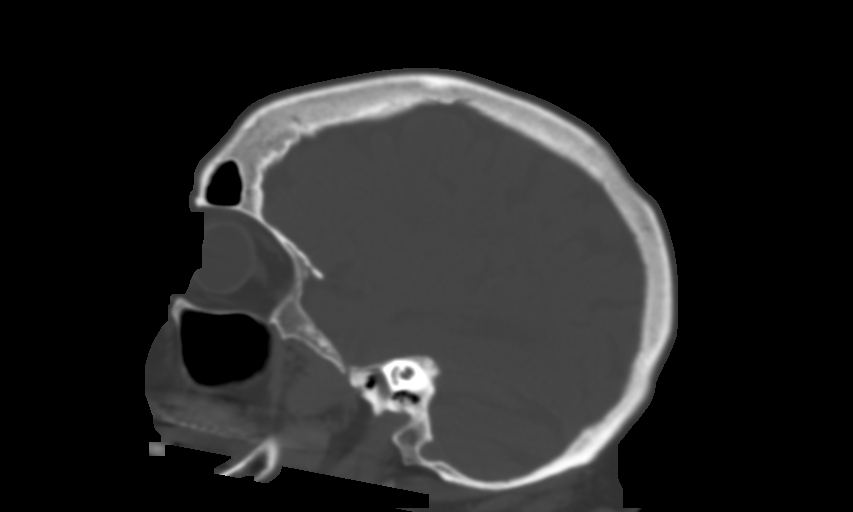

[Series 9: c_spine 2.0 st · axial · 0.45mm/px · z∈[+1114,+1234]mm · 4 of 91 slices shown, 5 images]
[im 21/91  soft-tissue]
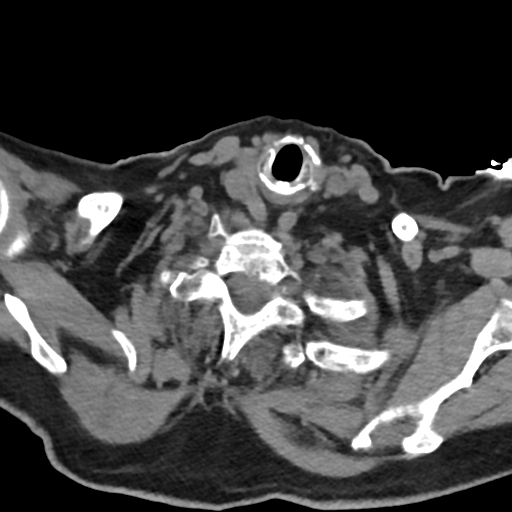
[im 21/91  bone]
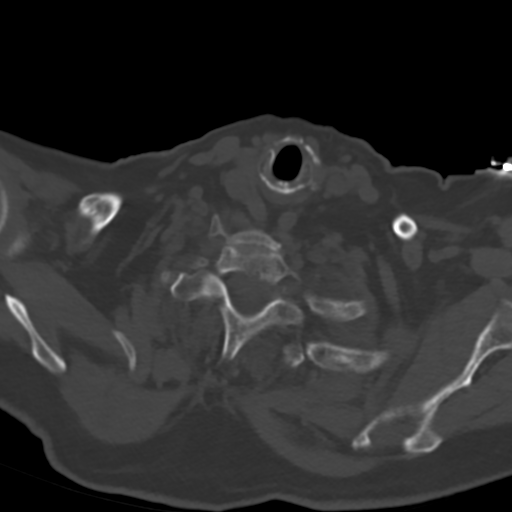
[im 41/91  bone]
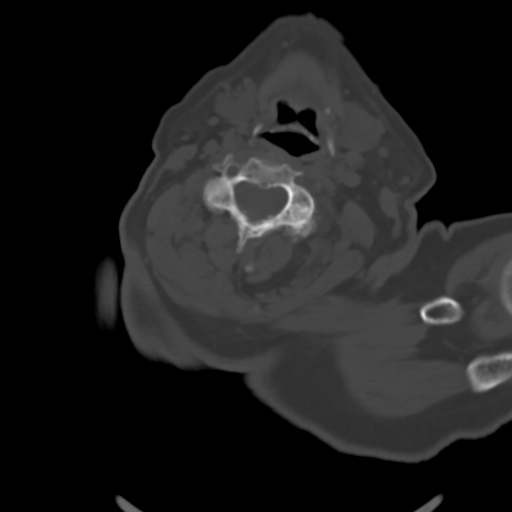
[im 61/91  bone]
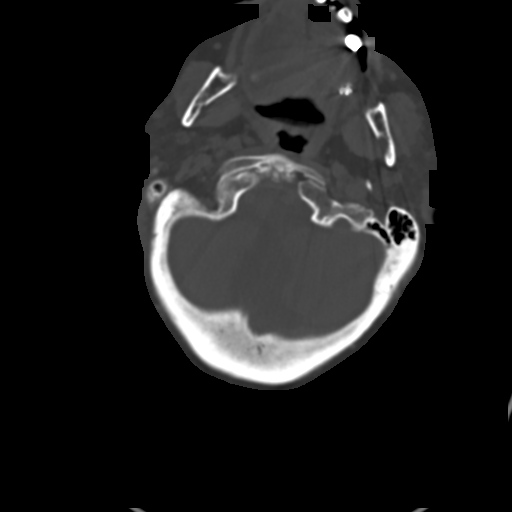
[im 81/91  bone]
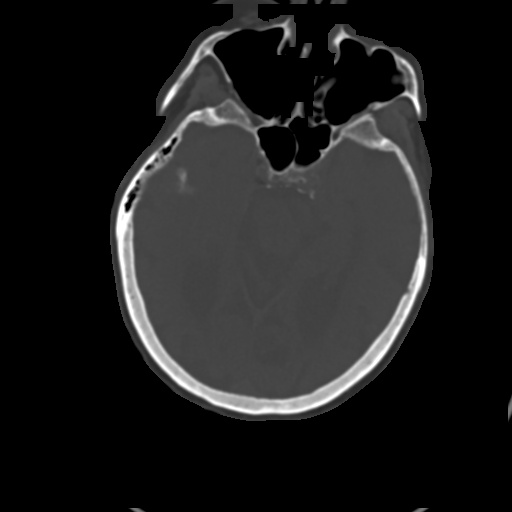

[12 of 33 positions shown; findings below may reference images not displayed]

FINDINGS: CT HEAD FINDINGS

Brain: No evidence of acute infarction, hemorrhage, hydrocephalus,
extra-axial collection or mass lesion/mass effect. Moderate brain
parenchymal volume loss and deep white matter microangiopathy.
Encephalomalacia from prior right periventricular subcortical
frontal infarct.

Vascular: No hyperdense vessel or unexpected calcification.

Skull: Normal. Negative for fracture or focal lesion.

Sinuses/Orbits: No acute finding.

Other: None.

CT CERVICAL SPINE FINDINGS

Alignment: Straightening of the cervical lordosis. Minimal
anterolisthesis of C4 on C5.

Skull base and vertebrae: No acute fracture. No primary bone lesion
or focal pathologic process.

Soft tissues and spinal canal: No prevertebral fluid or swelling. No
visible canal hematoma.

Disc levels: Multilevel osteoarthritic changes with disc space
narrowing, remodeling of vertebral bodies posterior facet
arthropathy.

Upper chest: Negative.

Other:
IMPRESSION: 1. No acute intracranial abnormality.
2. Atrophy, chronic microvascular disease. Old right
subcortical/periventricular frontal infarct.
3. No evidence of acute traumatic injury to the cervical spine.
4. Multilevel osteoarthritic changes of the cervical spine with
minimal anterolisthesis of C4 on C5.

## 2021-10-17 DEATH — deceased
